# Patient Record
Sex: Male | Born: 1955 | Race: White | Hispanic: No
Health system: Southern US, Community
[De-identification: ages and names within clinical notes are randomized; demographics above are authoritative.]

## PROBLEM LIST (undated history)

## (undated) DIAGNOSIS — F1010 Alcohol abuse, uncomplicated: Secondary | ICD-10-CM

## (undated) DIAGNOSIS — F419 Anxiety disorder, unspecified: Secondary | ICD-10-CM

## (undated) DIAGNOSIS — I10 Essential (primary) hypertension: Secondary | ICD-10-CM

## (undated) DIAGNOSIS — K219 Gastro-esophageal reflux disease without esophagitis: Secondary | ICD-10-CM

## (undated) DIAGNOSIS — M1990 Unspecified osteoarthritis, unspecified site: Secondary | ICD-10-CM

## (undated) HISTORY — PX: COLONOSCOPY: SHX174

## (undated) HISTORY — PX: LACERATION REPAIR: SHX5168

---

## 2013-09-07 ENCOUNTER — Telehealth: Payer: Self-pay | Admitting: *Deleted

## 2013-09-07 ENCOUNTER — Encounter: Payer: Self-pay | Admitting: Podiatry

## 2013-09-07 ENCOUNTER — Ambulatory Visit (INDEPENDENT_AMBULATORY_CARE_PROVIDER_SITE_OTHER): Payer: PRIVATE HEALTH INSURANCE | Admitting: Podiatry

## 2013-09-07 DIAGNOSIS — L608 Other nail disorders: Secondary | ICD-10-CM

## 2013-09-07 DIAGNOSIS — B079 Viral wart, unspecified: Secondary | ICD-10-CM

## 2013-09-07 NOTE — Telephone Encounter (Signed)
Left 1st toenail fragments sent to [facility] for definitive diagnosis of fungal elements. 

## 2013-09-07 NOTE — Progress Notes (Signed)
   Subjective:    Patient ID: Martin Harrington, male    DOB: 1956-03-11, 58 y.o.   MRN: [id number]  HPI Comments: Plantars wart on the left foot second met , it has been there for over ten years. Treated with over the counter . Left great toenail is also thick discolored and painful      Review of Systems  All other systems reviewed and are negative.       Objective:   Physical Exam: I did reviewed his past medical history medications allergies surgeries and social history. Vital signs are stable he is alert and oriented x3. Pulses are strongly palpable bilateral. Neurologic sensorium is intact per since once the monofilament. Deep tendon reflexes are intact bilateral brisk bilateral. Muscle strength is 5 over 5 dorsiflexors plantar flexors inverters everters all intrinsic musculature is intact. Orthopedic evaluation demonstrates all joints distal to the ankle a full range of motion without crepitation he does have flexible hammertoe deformities noted bilateral. Cutaneous evaluation demonstrates a supple while hydrated cutis with exception of dystrophic possibly mycotic toenails bilateral. #1 bilateral. Also a verruca plantaris sub-second metatarsophalangeal joint of the left foot. This is confirmed by skin lines circumventing the lesion as well as thrombosed capillaries are visible upon debridement of superficial skin.        Assessment & Plan:  Assessment: Dystrophic nails and probable tinea pedis care rule out onychomycosis without pathology. Verruca plantaris plantar aspect of the left foot.  Plan: Discussed etiology pathology conservative versus surgical therapies at this point he does not with the wart surgically removed. I did apply acid to the wart and we'll remain covered until tomorrow at which time we will wash the S. it off thoroughly and apply a Band-Aid. I took samples of the nails 1 through 5 today bilaterally and sent that for mycotic evaluation. I will followup with him once his  pathology comes in.

## 2013-10-10 ENCOUNTER — Encounter: Payer: Self-pay | Admitting: Podiatry

## 2013-10-10 ENCOUNTER — Ambulatory Visit (INDEPENDENT_AMBULATORY_CARE_PROVIDER_SITE_OTHER): Payer: PRIVATE HEALTH INSURANCE | Admitting: Podiatry

## 2013-10-10 VITALS — BP 130/102 | HR 68 | Resp 16

## 2013-10-10 DIAGNOSIS — B079 Viral wart, unspecified: Secondary | ICD-10-CM

## 2013-10-10 NOTE — Progress Notes (Signed)
Check plantars wart left foot . It seems to be getting better.  Objective: Vital signs are stable alert and oriented x3 solitary porokeratotic lesion sub-second metatarsophalangeal joint of the left foot demonstrates no erythema edema saline is drainage or odor just a porokeratosis.  Assessment: Porokeratosis sub-second metatarsal left foot.  Plan: Chemical debridement of the lesion today followup with him as needed for debridement. Pathology report does not demonstrate any type of onychomycosis nail dystrophies the diagnosis pair

## 2013-10-11 ENCOUNTER — Telehealth: Payer: Self-pay | Admitting: *Deleted

## 2013-10-11 NOTE — Telephone Encounter (Signed)
Per Dr. Al CorpusHyatt, I attempted to call the patient to give culture result.  No fungus was found.  May start the patient on Nuvail for dystrophic nails if he would like.  I left a message for a return call.

## 2013-10-13 ENCOUNTER — Encounter: Payer: Self-pay | Admitting: Podiatry

## 2013-10-26 NOTE — Telephone Encounter (Signed)
Pt called for fungal result. I informed the fungal cultures were negative, and offered the Nuvail as Dr Al CorpusHyatt recommended.  Pt states will call our office or go through the [place] office at his next appt and [facility] to order the Nuvail.

## 2013-11-06 ENCOUNTER — Ambulatory Visit (INDEPENDENT_AMBULATORY_CARE_PROVIDER_SITE_OTHER): Payer: PRIVATE HEALTH INSURANCE | Admitting: Podiatry

## 2013-11-06 VITALS — Resp 16 | Ht 69.0 in | Wt 155.0 lb

## 2013-11-06 DIAGNOSIS — Q828 Other specified congenital malformations of skin: Secondary | ICD-10-CM | POA: Diagnosis not present

## 2013-11-06 MED ORDER — NUVAIL EX SOLN: CUTANEOUS | Status: DC

## 2013-11-06 NOTE — Progress Notes (Signed)
Presents today for followup of the reactive hyperkeratotic lesion plantar aspect of the left foot. He states is doing much better.  Objective: Vital signs are stable he is alert and oriented x3. Pulses are palpable. Subsecond lesion appears to be doing much better though it is reactive in his growth. It does not appear to be verrucoid in nature.  Assessment: Porokeratosis sub-second metatarsal left foot.  Plan: Sharp debridement of reactive hyperkeratosis today and packed with a salicylic acid packing he will he bone for 3-4 days then washed off thoroughly I will followup with him on an as-needed basis.

## 2013-11-07 ENCOUNTER — Ambulatory Visit: Payer: PRIVATE HEALTH INSURANCE | Admitting: Podiatry

## 2013-11-15 ENCOUNTER — Telehealth: Payer: Self-pay | Admitting: *Deleted

## 2013-11-15 NOTE — Telephone Encounter (Signed)
Per Dr. Al CorpusHyatt, I called patient again to inform him that his culture was negative for fungus.  He said it was probably due to trauma.  We can prescribe Nuvail to help thin them out.  He said he saw Dr. Al CorpusHyatt last week and he actually gave him a prescription for the Nuvail.

## 2016-06-01 ENCOUNTER — Encounter (HOSPITAL_COMMUNITY): Payer: Self-pay | Admitting: Emergency Medicine

## 2016-06-01 ENCOUNTER — Emergency Department (HOSPITAL_COMMUNITY): Payer: Non-veteran care

## 2016-06-01 ENCOUNTER — Emergency Department (HOSPITAL_COMMUNITY)
Admission: EM | Admit: 2016-06-01 | Discharge: 2016-06-01 | Disposition: A | Discharge status: Home / Self Care | Payer: Non-veteran care | Attending: Emergency Medicine | Admitting: Emergency Medicine

## 2016-06-01 DIAGNOSIS — F17210 Nicotine dependence, cigarettes, uncomplicated: Secondary | ICD-10-CM | POA: Diagnosis not present

## 2016-06-01 DIAGNOSIS — R0781 Pleurodynia: Secondary | ICD-10-CM | POA: Diagnosis not present

## 2016-06-01 HISTORY — DX: Alcohol abuse, uncomplicated: F10.10

## 2016-06-01 MED ORDER — TRAMADOL HCL 50 MG PO TABS: 50.0000 mg | ORAL_TABLET | Freq: Four times a day (QID) | ORAL | 0 refills | Status: DC | PRN

## 2016-06-01 MED ORDER — NAPROXEN 500 MG PO TABS: 500.0000 mg | ORAL_TABLET | Freq: Two times a day (BID) | ORAL | 0 refills | Status: DC

## 2016-06-01 MED ORDER — KETOROLAC TROMETHAMINE 60 MG/2ML IM SOLN
60.0000 mg | Freq: Once | INTRAMUSCULAR | Status: AC
  Administered 2016-06-01: 60 mg via INTRAMUSCULAR
  Filled 2016-06-01: qty 2
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
## 2016-06-01 NOTE — Discharge Instructions (Signed)
There were no acute fractures on x-ray. Follow-up with a primary care provider on this issue. Ibuprofen or naproxen for pain. Tramadol for severe pain. Do not take medications like tramadol while consuming alcohol, driving, or performing other dangerous activities.

## 2016-06-01 NOTE — ED Notes (Signed)
Declined W/C at D/C and was escorted to lobby by RN. 

## 2016-06-01 NOTE — ED Triage Notes (Addendum)
PT has breathe sounds bilaterally. No abnormalities visualized.

## 2016-06-01 NOTE — ED Notes (Signed)
Patient transported to X-ray 

## 2018-04-09 ENCOUNTER — Encounter (HOSPITAL_COMMUNITY): Payer: Self-pay

## 2018-04-09 ENCOUNTER — Emergency Department (HOSPITAL_COMMUNITY): Payer: Non-veteran care

## 2018-04-09 ENCOUNTER — Other Ambulatory Visit: Payer: Self-pay

## 2018-04-09 ENCOUNTER — Observation Stay (HOSPITAL_COMMUNITY)
Admission: EM | Admit: 2018-04-09 | Discharge: 2018-04-10 | Disposition: A | Discharge status: Home / Self Care | Payer: No Typology Code available for payment source | Attending: Internal Medicine | Admitting: Internal Medicine

## 2018-04-09 DIAGNOSIS — S0291XA Unspecified fracture of skull, initial encounter for closed fracture: Secondary | ICD-10-CM | POA: Diagnosis present

## 2018-04-09 DIAGNOSIS — S065XAA Traumatic subdural hemorrhage with loss of consciousness status unknown, initial encounter: Secondary | ICD-10-CM | POA: Diagnosis present

## 2018-04-09 DIAGNOSIS — E860 Dehydration: Secondary | ICD-10-CM | POA: Insufficient documentation

## 2018-04-09 DIAGNOSIS — S0211HA Other fracture of occiput, left side, initial encounter for closed fracture: Secondary | ICD-10-CM | POA: Diagnosis not present

## 2018-04-09 DIAGNOSIS — Y908 Blood alcohol level of 240 mg/100 ml or more: Secondary | ICD-10-CM | POA: Insufficient documentation

## 2018-04-09 DIAGNOSIS — W19XXXA Unspecified fall, initial encounter: Secondary | ICD-10-CM | POA: Insufficient documentation

## 2018-04-09 DIAGNOSIS — F1010 Alcohol abuse, uncomplicated: Secondary | ICD-10-CM | POA: Diagnosis present

## 2018-04-09 DIAGNOSIS — E876 Hypokalemia: Secondary | ICD-10-CM | POA: Insufficient documentation

## 2018-04-09 DIAGNOSIS — R030 Elevated blood-pressure reading, without diagnosis of hypertension: Secondary | ICD-10-CM | POA: Diagnosis present

## 2018-04-09 DIAGNOSIS — I629 Nontraumatic intracranial hemorrhage, unspecified: Secondary | ICD-10-CM

## 2018-04-09 DIAGNOSIS — S02102A Fracture of base of skull, left side, initial encounter for closed fracture: Secondary | ICD-10-CM

## 2018-04-09 DIAGNOSIS — I10 Essential (primary) hypertension: Secondary | ICD-10-CM | POA: Insufficient documentation

## 2018-04-09 DIAGNOSIS — F17210 Nicotine dependence, cigarettes, uncomplicated: Secondary | ICD-10-CM | POA: Insufficient documentation

## 2018-04-09 DIAGNOSIS — S065X9A Traumatic subdural hemorrhage with loss of consciousness of unspecified duration, initial encounter: Secondary | ICD-10-CM

## 2018-04-09 DIAGNOSIS — S065X0A Traumatic subdural hemorrhage without loss of consciousness, initial encounter: Secondary | ICD-10-CM | POA: Insufficient documentation

## 2018-04-09 DIAGNOSIS — S0219XA Other fracture of base of skull, initial encounter for closed fracture: Principal | ICD-10-CM | POA: Insufficient documentation

## 2018-04-09 DIAGNOSIS — F10129 Alcohol abuse with intoxication, unspecified: Secondary | ICD-10-CM | POA: Insufficient documentation

## 2018-04-09 DIAGNOSIS — S02119A Unspecified fracture of occiput, initial encounter for closed fracture: Secondary | ICD-10-CM | POA: Insufficient documentation

## 2018-04-09 HISTORY — DX: Essential (primary) hypertension: I10

## 2018-04-09 LAB — CBC WITH DIFFERENTIAL/PLATELET
ABS IMMATURE GRANULOCYTES: 0 10*3/uL (ref 0.0–0.1)
Abs Immature Granulocytes: 0 10*3/uL (ref 0.0–0.1)
BASOS ABS: 0.1 10*3/uL (ref 0.0–0.1)
BASOS PCT: 1 %
Basophils Absolute: 0.1 10*3/uL (ref 0.0–0.1)
Basophils Relative: 1 %
EOS ABS: 0.2 10*3/uL (ref 0.0–0.7)
EOS ABS: 0.1 10*3/uL (ref 0.0–0.7)
EOS PCT: 1 %
Eosinophils Relative: 2 %
HCT: 48.2 % (ref 39.0–52.0)
HEMATOCRIT: 48 % (ref 39.0–52.0)
HEMOGLOBIN: 15.5 g/dL (ref 13.0–17.0)
Hemoglobin: 16 g/dL (ref 13.0–17.0)
IMMATURE GRANULOCYTES: 0 %
Immature Granulocytes: 0 %
LYMPHS ABS: 2.5 10*3/uL (ref 0.7–4.0)
LYMPHS PCT: 11 %
Lymphocytes Relative: 32 %
Lymphs Abs: 1.1 10*3/uL (ref 0.7–4.0)
MCH: 30.9 pg (ref 26.0–34.0)
MCH: 30.8 pg (ref 26.0–34.0)
MCHC: 33.3 g/dL (ref 30.0–36.0)
MCHC: 32.2 g/dL (ref 30.0–36.0)
MCV: 92.7 fL (ref 78.0–100.0)
MCV: 95.8 fL (ref 78.0–100.0)
MONO ABS: 0.7 10*3/uL (ref 0.1–1.0)
Monocytes Absolute: 0.8 10*3/uL (ref 0.1–1.0)
Monocytes Relative: 10 %
Monocytes Relative: 8 %
NEUTROS ABS: 4.3 10*3/uL (ref 1.7–7.7)
Neutro Abs: 7.8 10*3/uL — ABNORMAL HIGH (ref 1.7–7.7)
Neutrophils Relative %: 55 %
Neutrophils Relative %: 79 %
Platelets: 230 10*3/uL (ref 150–400)
Platelets: 224 10*3/uL (ref 150–400)
RBC: 5.18 MIL/uL (ref 4.22–5.81)
RBC: 5.03 MIL/uL (ref 4.22–5.81)
RDW: 13.7 % (ref 11.5–15.5)
RDW: 14 % (ref 11.5–15.5)
WBC: 7.8 10*3/uL (ref 4.0–10.5)
WBC: 9.8 10*3/uL (ref 4.0–10.5)

## 2018-04-09 LAB — COMPREHENSIVE METABOLIC PANEL
ALBUMIN: 4 g/dL (ref 3.5–5.0)
ALT: 16 U/L (ref 0–44)
AST: 21 U/L (ref 15–41)
Alkaline Phosphatase: 67 U/L (ref 38–126)
Anion gap: 14 (ref 5–15)
BILIRUBIN TOTAL: 0.5 mg/dL (ref 0.3–1.2)
BUN: 11 mg/dL (ref 6–20)
CO2: 26 mmol/L (ref 22–32)
Calcium: 9.2 mg/dL (ref 8.9–10.3)
Chloride: 98 mmol/L (ref 98–111)
Creatinine, Ser: 0.76 mg/dL (ref 0.61–1.24)
GFR calc Af Amer: >60 mL/min (ref >60)
GFR calc non Af Amer: >60 mL/min (ref >60)
GLUCOSE: 102 mg/dL — AB (ref 70–99)
POTASSIUM: 3.2 mmol/L — AB (ref 3.5–5.1)
Sodium: 138 mmol/L (ref 135–145)
TOTAL PROTEIN: 6.9 g/dL (ref 6.5–8.1)

## 2018-04-09 LAB — HEPATIC FUNCTION PANEL
ALBUMIN: 3.7 g/dL (ref 3.5–5.0)
ALK PHOS: 61 U/L (ref 38–126)
ALT: 15 U/L (ref 0–44)
AST: 20 U/L (ref 15–41)
BILIRUBIN DIRECT: 0.1 mg/dL (ref 0.0–0.2)
BILIRUBIN INDIRECT: 0.3 mg/dL (ref 0.3–0.9)
BILIRUBIN TOTAL: 0.4 mg/dL (ref 0.3–1.2)
Total Protein: 6.3 g/dL — ABNORMAL LOW (ref 6.5–8.1)

## 2018-04-09 LAB — BASIC METABOLIC PANEL
Anion gap: 11 (ref 5–15)
BUN: 10 mg/dL (ref 6–20)
CALCIUM: 8.4 mg/dL — AB (ref 8.9–10.3)
CHLORIDE: 106 mmol/L (ref 98–111)
CO2: 23 mmol/L (ref 22–32)
CREATININE: 0.6 mg/dL — AB (ref 0.61–1.24)
GFR calc non Af Amer: >60 mL/min (ref >60)
Glucose, Bld: 113 mg/dL — ABNORMAL HIGH (ref 70–99)
Potassium: 3.6 mmol/L (ref 3.5–5.1)
Sodium: 140 mmol/L (ref 135–145)

## 2018-04-09 LAB — ETHANOL: Alcohol, Ethyl (B): 353 mg/dL (ref <10)

## 2018-04-09 LAB — CBG MONITORING, ED
GLUCOSE-CAPILLARY: 92 mg/dL (ref 70–99)
Glucose-Capillary: 108 mg/dL — ABNORMAL HIGH (ref 70–99)
Glucose-Capillary: 68 mg/dL — ABNORMAL LOW (ref 70–99)

## 2018-04-09 LAB — HIV ANTIBODY (ROUTINE TESTING W REFLEX): HIV Screen 4th Generation wRfx: NONREACTIVE

## 2018-04-09 LAB — MAGNESIUM: MAGNESIUM: 1.7 mg/dL (ref 1.7–2.4)

## 2018-04-09 MED ORDER — IBUPROFEN 200 MG PO TABS
400.0000 mg | ORAL_TABLET | Freq: Three times a day (TID) | ORAL | Status: DC
  Administered 2018-04-09 – 2018-04-10 (×2): 400 mg via ORAL
  Filled 2018-04-09: qty 1
  Filled 2018-04-09: qty 2

## 2018-04-09 MED ORDER — MAGNESIUM SULFATE 2 GM/50ML IV SOLN
2.0000 g | Freq: Once | INTRAVENOUS | Status: AC
  Administered 2018-04-09: 2 g via INTRAVENOUS
  Filled 2018-04-09: qty 50

## 2018-04-09 MED ORDER — ACETAMINOPHEN 325 MG PO TABS
650.0000 mg | ORAL_TABLET | Freq: Four times a day (QID) | ORAL | Status: DC | PRN
  Administered 2018-04-10: 650 mg via ORAL
  Filled 2018-04-09: qty 2

## 2018-04-09 MED ORDER — KETOROLAC TROMETHAMINE 15 MG/ML IJ SOLN
15.0000 mg | Freq: Four times a day (QID) | INTRAMUSCULAR | Status: DC | PRN
  Administered 2018-04-10: 15 mg via INTRAVENOUS
  Filled 2018-04-09: qty 1

## 2018-04-09 MED ORDER — IOPAMIDOL (ISOVUE-370) INJECTION 76%
INTRAVENOUS | Status: AC
  Filled 2018-04-09: qty 50

## 2018-04-09 MED ORDER — POTASSIUM CHLORIDE CRYS ER 20 MEQ PO TBCR
40.0000 meq | EXTENDED_RELEASE_TABLET | Freq: Once | ORAL | Status: AC
  Administered 2018-04-09: 40 meq via ORAL
  Filled 2018-04-09: qty 2

## 2018-04-09 MED ORDER — ONDANSETRON HCL 4 MG/2ML IJ SOLN: 4.0000 mg | Freq: Four times a day (QID) | INTRAMUSCULAR | Status: DC | PRN

## 2018-04-09 MED ORDER — PANTOPRAZOLE SODIUM 40 MG PO TBEC
40.0000 mg | DELAYED_RELEASE_TABLET | Freq: Every day | ORAL | Status: DC
  Administered 2018-04-09 – 2018-04-10 (×2): 40 mg via ORAL
  Filled 2018-04-09 (×2): qty 1

## 2018-04-09 MED ORDER — ACETAMINOPHEN 650 MG RE SUPP: 650.0000 mg | Freq: Four times a day (QID) | RECTAL | Status: DC | PRN

## 2018-04-09 MED ORDER — SODIUM CHLORIDE 0.9 % IV BOLUS
1000.0000 mL | Freq: Once | INTRAVENOUS | Status: AC
  Administered 2018-04-09: 1000 mL via INTRAVENOUS

## 2018-04-09 MED ORDER — LORAZEPAM 2 MG/ML IJ SOLN: 0.0000 mg | Freq: Two times a day (BID) | INTRAMUSCULAR | Status: DC

## 2018-04-09 MED ORDER — SODIUM CHLORIDE 0.9 % IV SOLN
INTRAVENOUS | Status: DC
  Administered 2018-04-09: 10:00:00 via INTRAVENOUS

## 2018-04-09 MED ORDER — LORAZEPAM 2 MG/ML IJ SOLN
1.0000 mg | Freq: Four times a day (QID) | INTRAMUSCULAR | Status: DC | PRN
  Administered 2018-04-09 – 2018-04-10 (×2): 1 mg via INTRAVENOUS
  Filled 2018-04-09 (×2): qty 1

## 2018-04-09 MED ORDER — HYDRALAZINE HCL 20 MG/ML IJ SOLN
5.0000 mg | INTRAMUSCULAR | Status: DC | PRN
  Administered 2018-04-09 – 2018-04-10 (×4): 5 mg via INTRAVENOUS
  Filled 2018-04-09 (×4): qty 1

## 2018-04-09 MED ORDER — ADULT MULTIVITAMIN W/MINERALS CH
1.0000 | ORAL_TABLET | Freq: Every day | ORAL | Status: DC
  Administered 2018-04-09 – 2018-04-10 (×2): 1 via ORAL
  Filled 2018-04-09 (×2): qty 1

## 2018-04-09 MED ORDER — LORAZEPAM 1 MG PO TABS: 1.0000 mg | ORAL_TABLET | Freq: Four times a day (QID) | ORAL | Status: DC | PRN

## 2018-04-09 MED ORDER — DEXTROSE IN LACTATED RINGERS 5 % IV SOLN
INTRAVENOUS | Status: DC
  Administered 2018-04-09 – 2018-04-10 (×2): via INTRAVENOUS

## 2018-04-09 MED ORDER — IOPAMIDOL (ISOVUE-370) INJECTION 76%
50.0000 mL | Freq: Once | INTRAVENOUS | Status: AC | PRN
  Administered 2018-04-09: 50 mL via INTRAVENOUS

## 2018-04-09 MED ORDER — VITAMIN B-1 100 MG PO TABS
100.0000 mg | ORAL_TABLET | Freq: Every day | ORAL | Status: DC
  Filled 2018-04-09: qty 1

## 2018-04-09 MED ORDER — THIAMINE HCL 100 MG/ML IJ SOLN
100.0000 mg | Freq: Every day | INTRAMUSCULAR | Status: DC
  Administered 2018-04-09 – 2018-04-10 (×2): 100 mg via INTRAVENOUS
  Filled 2018-04-09 (×2): qty 2

## 2018-04-09 MED ORDER — ONDANSETRON HCL 4 MG PO TABS: 4.0000 mg | ORAL_TABLET | Freq: Four times a day (QID) | ORAL | Status: DC | PRN

## 2018-04-09 MED ORDER — FOLIC ACID 1 MG PO TABS: 1.0000 mg | ORAL_TABLET | Freq: Every day | ORAL | Status: DC

## 2018-04-09 MED ORDER — LORAZEPAM 2 MG/ML IJ SOLN
0.0000 mg | Freq: Four times a day (QID) | INTRAMUSCULAR | Status: DC
  Administered 2018-04-09: 1 mg via INTRAVENOUS
  Filled 2018-04-09: qty 1

## 2018-04-09 NOTE — ED Notes (Signed)
Sitter Deforest HoylesK Cane at bedside

## 2018-04-09 NOTE — ED Notes (Signed)
Hard C Collar changed to aspen collar- pt responsive, c/o pain in left jaw/teeth area.

## 2018-04-09 NOTE — ED Notes (Signed)
c-collar removed per order
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
## 2018-04-09 NOTE — ED Provider Notes (Signed)
[facility] EMERGENCY DEPARTMENT Provider Note   CSN: [id number] Arrival date & time: 04/09/18  0133     History   Chief Complaint Chief Complaint  Patient presents with  . Fall    HPI Martin Harrington is a 62 y.o. male.  Patient brought to the emergency department by ambulance from home.  Patient has reportedly been drinking heavily tonight.  He apparently went outside at some points and fell.  Fall was unwitnessed.  Family found him lying on the ground at the bottom of 2 steps.  He was lying on concrete.  He appears to have lacerations on the back of his head and bleeding from his left ear, according to EMS.  Patient is alert at arrival, does not remember falling.  He denies any pain at this time.  He is obviously intoxicated.     Past Medical History:  Diagnosis Date  . Hypertension     There are no active problems to display for this patient.   History reviewed. No pertinent surgical history.      Home Medications    Prior to Admission medications   Medication Sig Start Date End Date Taking? Authorizing Provider  Bismuth Subsalicylate (STOMACH RELIEF PO) Take 1 tablet by mouth daily.   Yes [provider]    Family History History reviewed. No pertinent family history.  Social History Social History   Tobacco Use  . Smoking status: Current Every Day Smoker    Packs/day: 1.00  . Smokeless tobacco: Never Used  Substance Use Topics  . Alcohol use: Yes  . Drug use: Not Currently     Allergies   Patient has no known allergies.   Review of Systems Review of Systems  Unable to perform ROS: Other (intoxication)     Physical Exam Updated Vital Signs BP (!) 140/97   Pulse 89   Temp (!) 97 F (36.1 C) (Temporal)   Resp 16   Ht 5\' 9"  (1.753 m)   Wt 74.8 kg   SpO2 95%   BMI 24.37 kg/m   Physical Exam  Constitutional: He appears well-developed and well-nourished.  HENT:  Head: Normocephalic. Head is with abrasion  and with contusion. Head laceration: occipital scalp.    Left ear canal obscured by blood, cannot visualize tympanic membrane, no obvious lacerations noted to the canal  Eyes: Pupils are equal, round, and reactive to light. Right eye exhibits nystagmus. Left eye exhibits nystagmus.  Neck: Neck supple.  Cardiovascular: Normal rate and regular rhythm.  Pulmonary/Chest: Effort normal and breath sounds normal.  Abdominal: Soft. There is no tenderness.  Musculoskeletal: Normal range of motion. He exhibits no edema, tenderness or deformity.  Neurological: He is alert. He has normal strength. No cranial nerve deficit or sensory deficit. GCS eye subscore is 4. GCS verbal subscore is 4. GCS motor subscore is 6.  Skin: Laceration noted.  Psychiatric: His speech is slurred.     ED Treatments / Results  Labs (all labs ordered are listed, but only abnormal results are displayed) Labs Reviewed  COMPREHENSIVE METABOLIC PANEL - Abnormal; Notable for the following components:      Result Value   Potassium 3.2 (*)    Glucose, Bld 102 (*)    All other components within normal limits  ETHANOL - Abnormal; Notable for the following components:   Alcohol, Ethyl (B) 353 (*)    All other components within normal limits  CBG MONITORING, ED - Abnormal; Notable for the following components:  Glucose-Capillary 108 (*)    All other components within normal limits  CBG MONITORING, ED - Abnormal; Notable for the following components:   Glucose-Capillary 68 (*)    All other components within normal limits  CBC WITH DIFFERENTIAL/PLATELET    EKG None  Radiology Ct Angio Head W Or Wo Contrast  Result Date: 04/09/2018 CLINICAL DATA:  Trauma, skull fracture at high risk for arterial injury. EXAM: CT ANGIOGRAPHY HEAD AND NECK TECHNIQUE: Multidetector CT imaging of the head and neck was performed using the standard protocol during bolus administration of intravenous contrast. Multiplanar CT image reconstructions  and MIPs were obtained to evaluate the vascular anatomy. Carotid stenosis measurements (when applicable) are obtained utilizing NASCET criteria, using the distal internal carotid diameter as the denominator. CONTRAST:  50mL ISOVUE-370 IOPAMIDOL (ISOVUE-370) INJECTION 76% COMPARISON:  CT HEAD and cervical spine April 09, 2018 at 0156 hours FINDINGS: CTA NECK FINDINGS: AORTIC ARCH: Normal appearance of the thoracic arch, normal branch pattern. Mild calcific atherosclerosis aortic arch. The origins of the innominate, left Common carotid artery and subclavian artery are widely patent. RIGHT CAROTID SYSTEM: Common carotid artery is patent. Moderate calcific atherosclerosis of the carotid bifurcation without hemodynamically significant stenosis by NASCET criteria. Normal appearance of the internal carotid artery. LEFT CAROTID SYSTEM: Common carotid artery is patent. Mild calcific atherosclerosis of the carotid bifurcation without hemodynamically significant stenosis by NASCET criteria. Normal appearance of the internal carotid artery. VERTEBRAL ARTERIES:Left vertebral artery is dominant. Patent vertebral arteries mild extrinsic compression due to degenerative cervical spine. SKELETON: No acute osseous process though bone windows have not been submitted. OTHER NECK: LEFT external auditory canal, middle ear and mastoid blood products. Known fracture better seen on today's CT. Associated gas tracking in the proximal LEFT neck. UPPER CHEST: Included lung apices are clear. Mild centrilobular emphysema. No superior mediastinal lymphadenopathy. CTA HEAD FINDINGS: ANTERIOR CIRCULATION: Patent cervical internal carotid arteries, petrous, cavernous and supra clinoid internal carotid arteries. Patent anterior communicating artery. Patent anterior and middle cerebral arteries. No large vessel occlusion, significant stenosis, contrast extravasation or aneurysm. POSTERIOR CIRCULATION: Patent vertebral arteries, vertebrobasilar  junction and basilar artery, as well as main branch vessels. Patent posterior cerebral arteries. No large vessel occlusion, significant stenosis, contrast extravasation or aneurysm. VENOUS SINUSES: Major dural venous sinuses are patent though not tailored for evaluation on this angiographic examination. ANATOMIC VARIANTS: None. DELAYED PHASE: No abnormal intracranial enhancement. MIP images reviewed. IMPRESSION: CTA NECK: 1. No acute vascular injury. 2. No hemodynamically significant stenosis ICA's. Patent vertebral arteries. CTA HEAD: 1. No acute vascular injury. 2. No acute large vessel occlusion or flow-limiting stenosis. Aortic Atherosclerosis (ICD10-I70.0). Emphysema (ICD10-J43.9). Electronically Signed   By: Awilda Metro M.D.   On: 04/09/2018 04:09   Dg Chest 1 View  Result Date: 04/09/2018 CLINICAL DATA:  Post fall. EXAM: CHEST  1 VIEW COMPARISON:  None. FINDINGS: The cardiomediastinal contours are normal. The lungs are clear. Pulmonary vasculature is normal. No consolidation, pleural effusion, or pneumothorax. Multiple remote right rib fractures. No acute rib fracture visualized. IMPRESSION: No acute findings.  Remote right rib fractures. Electronically Signed   By: Narda Rutherford M.D.   On: 04/09/2018 02:39   Dg Pelvis 1-2 Views  Result Date: 04/09/2018 CLINICAL DATA:  Post fall. EXAM: PELVIS - 1-2 VIEW COMPARISON:  None. FINDINGS: The cortical margins of the bony pelvis are intact. No fracture. Pubic symphysis and sacroiliac joints are congruent. Both femoral heads are well-seated in the respective acetabula. IMPRESSION: No pelvic fracture. Electronically Signed  By: Narda Rutherford M.D.   On: 04/09/2018 02:40   Ct Head Wo Contrast  Result Date: 04/09/2018 CLINICAL DATA:  Fall tonight. Head trauma, CSF leak suspected; C-spine trauma, high clinical risk (NEXUS/CCR) EXAM: CT HEAD WITHOUT CONTRAST CT CERVICAL SPINE WITHOUT CONTRAST TECHNIQUE: Multidetector CT imaging of the head and  cervical spine was performed following the standard protocol without intravenous contrast. Multiplanar CT image reconstructions of the cervical spine were also generated. COMPARISON:  None. FINDINGS: CT HEAD FINDINGS Brain: Thin acute right subdural hematoma in the right frontal region measures approximately 4 mm. No associated mass effect or midline shift. No subarachnoid or intraparenchymal hemorrhage. No hydrocephalus. No evidence of acute ischemia. Vascular: Generalized increased density of intravascular structures without hyperdense vessel. Skull: Left-sided skull fracture involving the left occipital and temporal bone, fracture extends through the mastoid air cells with opacification, fracture plane not well characterized. Fracture extends through the skull base likely through the jugular foramen and carotid canal extending into the sphenoid sinus. Sinuses/Orbits: Left hemosinus related to skull base fracture. Opacification of left mastoid air cells related to temporal bone fracture. Undulation of left zygomatic arch likely remote injury, no acute fracture visualized. Mild right maxillary mucosal thickening. Other: Left occipital scalp hematoma. CT CERVICAL SPINE FINDINGS Alignment: Normal. Skull base and vertebrae: Skull base fracture extends through the left occipital and temporal bones and the left sphenoid sinus. No occipital condyle fracture. No cervical spine fracture. The dens is intact. Soft tissues and spinal canal: No prevertebral fluid or swelling. No visible canal hematoma. Disc levels: Diffuse disc space narrowing and endplate spurring and facet arthropathy. Modic endplate changes and vacuum phenomena at multiple levels. Upper chest: Mild emphysema.  No acute findings. Other: Carotid calcifications. IMPRESSION: 1. Acute thin right subdural hematoma measuring 4 mm without midline shift. 2. Left skull fracture involving the temporal and occipital bones extending through the mastoid air cells into  the skull base. Fracture extends through the jugular foramen and carotid canal to the sphenoid sinus. Associated left hemosinus. Recommend head and neck CTA to evaluate for vascular injury. 3. Degenerative change in the cervical spine without cervical spine fracture. Critical Value/emergent results were called by telephone at the time of interpretation on 04/09/2018 at 2:24 am to Dr. Jaci Carrel , who verbally acknowledged these results. Electronically Signed   By: Narda Rutherford M.D.   On: 04/09/2018 02:26   Ct Angio Neck W And/or Wo Contrast  Result Date: 04/09/2018 CLINICAL DATA:  Trauma, skull fracture at high risk for arterial injury. EXAM: CT ANGIOGRAPHY HEAD AND NECK TECHNIQUE: Multidetector CT imaging of the head and neck was performed using the standard protocol during bolus administration of intravenous contrast. Multiplanar CT image reconstructions and MIPs were obtained to evaluate the vascular anatomy. Carotid stenosis measurements (when applicable) are obtained utilizing NASCET criteria, using the distal internal carotid diameter as the denominator. CONTRAST:  50mL ISOVUE-370 IOPAMIDOL (ISOVUE-370) INJECTION 76% COMPARISON:  CT HEAD and cervical spine April 09, 2018 at 0156 hours FINDINGS: CTA NECK FINDINGS: AORTIC ARCH: Normal appearance of the thoracic arch, normal branch pattern. Mild calcific atherosclerosis aortic arch. The origins of the innominate, left Common carotid artery and subclavian artery are widely patent. RIGHT CAROTID SYSTEM: Common carotid artery is patent. Moderate calcific atherosclerosis of the carotid bifurcation without hemodynamically significant stenosis by NASCET criteria. Normal appearance of the internal carotid artery. LEFT CAROTID SYSTEM: Common carotid artery is patent. Mild calcific atherosclerosis of the carotid bifurcation without hemodynamically significant stenosis by NASCET criteria.  Normal appearance of the internal carotid artery. VERTEBRAL  ARTERIES:Left vertebral artery is dominant. Patent vertebral arteries mild extrinsic compression due to degenerative cervical spine. SKELETON: No acute osseous process though bone windows have not been submitted. OTHER NECK: LEFT external auditory canal, middle ear and mastoid blood products. Known fracture better seen on today's CT. Associated gas tracking in the proximal LEFT neck. UPPER CHEST: Included lung apices are clear. Mild centrilobular emphysema. No superior mediastinal lymphadenopathy. CTA HEAD FINDINGS: ANTERIOR CIRCULATION: Patent cervical internal carotid arteries, petrous, cavernous and supra clinoid internal carotid arteries. Patent anterior communicating artery. Patent anterior and middle cerebral arteries. No large vessel occlusion, significant stenosis, contrast extravasation or aneurysm. POSTERIOR CIRCULATION: Patent vertebral arteries, vertebrobasilar junction and basilar artery, as well as main branch vessels. Patent posterior cerebral arteries. No large vessel occlusion, significant stenosis, contrast extravasation or aneurysm. VENOUS SINUSES: Major dural venous sinuses are patent though not tailored for evaluation on this angiographic examination. ANATOMIC VARIANTS: None. DELAYED PHASE: No abnormal intracranial enhancement. MIP images reviewed. IMPRESSION: CTA NECK: 1. No acute vascular injury. 2. No hemodynamically significant stenosis ICA's. Patent vertebral arteries. CTA HEAD: 1. No acute vascular injury. 2. No acute large vessel occlusion or flow-limiting stenosis. Aortic Atherosclerosis (ICD10-I70.0). Emphysema (ICD10-J43.9). Electronically Signed   By: Awilda Metroourtnay  Bloomer M.D.   On: 04/09/2018 04:09   Ct Cervical Spine Wo Contrast  Result Date: 04/09/2018 CLINICAL DATA:  Fall tonight. Head trauma, CSF leak suspected; C-spine trauma, high clinical risk (NEXUS/CCR) EXAM: CT HEAD WITHOUT CONTRAST CT CERVICAL SPINE WITHOUT CONTRAST TECHNIQUE: Multidetector CT imaging of the head and  cervical spine was performed following the standard protocol without intravenous contrast. Multiplanar CT image reconstructions of the cervical spine were also generated. COMPARISON:  None. FINDINGS: CT HEAD FINDINGS Brain: Thin acute right subdural hematoma in the right frontal region measures approximately 4 mm. No associated mass effect or midline shift. No subarachnoid or intraparenchymal hemorrhage. No hydrocephalus. No evidence of acute ischemia. Vascular: Generalized increased density of intravascular structures without hyperdense vessel. Skull: Left-sided skull fracture involving the left occipital and temporal bone, fracture extends through the mastoid air cells with opacification, fracture plane not well characterized. Fracture extends through the skull base likely through the jugular foramen and carotid canal extending into the sphenoid sinus. Sinuses/Orbits: Left hemosinus related to skull base fracture. Opacification of left mastoid air cells related to temporal bone fracture. Undulation of left zygomatic arch likely remote injury, no acute fracture visualized. Mild right maxillary mucosal thickening. Other: Left occipital scalp hematoma. CT CERVICAL SPINE FINDINGS Alignment: Normal. Skull base and vertebrae: Skull base fracture extends through the left occipital and temporal bones and the left sphenoid sinus. No occipital condyle fracture. No cervical spine fracture. The dens is intact. Soft tissues and spinal canal: No prevertebral fluid or swelling. No visible canal hematoma. Disc levels: Diffuse disc space narrowing and endplate spurring and facet arthropathy. Modic endplate changes and vacuum phenomena at multiple levels. Upper chest: Mild emphysema.  No acute findings. Other: Carotid calcifications. IMPRESSION: 1. Acute thin right subdural hematoma measuring 4 mm without midline shift. 2. Left skull fracture involving the temporal and occipital bones extending through the mastoid air cells into  the skull base. Fracture extends through the jugular foramen and carotid canal to the sphenoid sinus. Associated left hemosinus. Recommend head and neck CTA to evaluate for vascular injury. 3. Degenerative change in the cervical spine without cervical spine fracture. Critical Value/emergent results were called by telephone at the time of interpretation on 04/09/2018  at 2:24 am to Dr. Jaci Carrel , who verbally acknowledged these results. Electronically Signed   By: Narda Rutherford M.D.   On: 04/09/2018 02:26    Procedures .Critical Care Performed by: Gilda Crease, MD Authorized by: Gilda Crease, MD   Critical care provider statement:    Critical care time (minutes):  30   Critical care time was exclusive of:  Separately billable procedures and treating other patients   Critical care was necessary to treat or prevent imminent or life-threatening deterioration of the following conditions:  Trauma   Critical care was time spent personally by me on the following activities:  Ordering and performing treatments and interventions, ordering and review of laboratory studies, development of treatment plan with patient or surrogate, discussions with consultants, ordering and review of radiographic studies, pulse oximetry, evaluation of patient's response to treatment, re-evaluation of patient's condition, review of old charts and examination of patient   (including critical care time)  Medications Ordered in ED Medications  iopamidol (ISOVUE-370) 76 % injection (has no administration in time range)  sodium chloride 0.9 % bolus 1,000 mL (0 mLs Intravenous Stopped 04/09/18 0316)  iopamidol (ISOVUE-370) 76 % injection 50 mL (50 mLs Intravenous Contrast Given 04/09/18 0322)     Initial Impression / Assessment and Plan / ED Course  I have reviewed the triage vital signs and the nursing notes.  Pertinent labs & imaging results that were available during my care of the patient were  reviewed by me and considered in my medical decision making (see chart for details).     Patient presents to the emergency department for evaluation after a fall.  Fall was unwitnessed, it appears that he fell down some steps onto concrete.  Patient has a large contusion on the left occipital scalp with overlying abrasion, no repair necessary.  He was without complaints at arrival but was obviously very intoxicated.  It is therefore difficult to ascertain his mental status.  He does not have any focal neurologic deficits on exam.  CT scan does show left occipital bone fracture that extends into the skull base and involves jugular foramen and carotid canal.  Patient has a 4 mm right subdural hematoma without mass-effect.  Patient sent back to radiology for CT angiography of head and neck, no vascular injury associated with the skull fracture.  Discussed with Dr. Johnsie Cancel, on-call for neurosurgery.  Does not feel that there is any need for intervention at this time with patient's injuries, agrees with observation and will see patient in the morning for consultation.  Final Clinical Impressions(s) / ED Diagnoses   Final diagnoses:  Fall  SDH (subdural hematoma) (HCC)  Other closed fracture of left side of occipital bone, initial encounter [facility])    ED Discharge Orders    None       Gilda Crease, MD 04/09/18 [phone]

## 2018-04-09 NOTE — H&P (Signed)
History and Physical    Martin Harrington ZOX:[id number] DOB: May 06, 1956 DOA: 04/09/2018  PCP: Patient, No Pcp Per  Patient coming from: Home.  Chief Complaint: Fall.  HPI: Martin Harrington is a 62 y.o. male with history of alcoholism who had a AV alcohol drink yesterday and was found on the floor below 2 steps at his home on a concrete floor with bleeding from his left ear and occipital area.  Patient does not recall falling but does accept to having drinking alcohol yesterday.  Complains of left-sided headache and also was found to have some CSF leak from the left ear.  ED Course: In the ER patient is alert awake at times hallucinating.  Complains of seeing some aliens in the ER as per the ER physician.  CT of the head shows left temporal and occipital bone fracture with subdural hematoma on-call neurosurgeon has been consulted.  CT angiogram of the head and neck was done.  Patient admitted for further management of skull fracture and alcohol abuse with possible withdrawal.  At the time of my exam there is no active discharge from the left ear.  Review of Systems: As per HPI, rest all negative.   Past Medical History:  Diagnosis Date  . Hypertension     History reviewed. No pertinent surgical history.   reports that he has been smoking. He has been smoking about 1.00 pack per day. He has never used smokeless tobacco. He reports that he drinks alcohol. He reports that he has current or past drug history.  No Known Allergies  Family History  Problem Relation Age of Onset  . CAD Neg Hx   . Diabetes Mellitus II Neg Hx     Prior to Admission medications   Medication Sig Start Date End Date Taking? Authorizing Provider  Bismuth Subsalicylate (STOMACH RELIEF PO) Take 1 tablet by mouth daily.   Yes [provider]    Physical Exam: Vitals:   04/09/18 0545 04/09/18 0600 04/09/18 0615 04/09/18 0700  BP: (!) 142/79 (!) 167/93 (!) 158/90 (!) 168/96  Pulse: 71 72 72 72    Resp: 15 18 17 14   Temp:      TempSrc:      SpO2: 93% 94% 91% 94%  Weight:      Height:          Constitutional: Moderately built and nourished. Vitals:   04/09/18 0545 04/09/18 0600 04/09/18 0615 04/09/18 0700  BP: (!) 142/79 (!) 167/93 (!) 158/90 (!) 168/96  Pulse: 71 72 72 72  Resp: 15 18 17 14   Temp:      TempSrc:      SpO2: 93% 94% 91% 94%  Weight:      Height:       Eyes: Anicteric no pallor. ENMT: No active discharge from the ears eyes nose or mouth or ears. Neck: Neck collar in place. Respiratory: No rhonchi or crepitations. Cardiovascular: S1-S2 heard no murmurs appreciated. Abdomen: Soft nontender bowel sounds present. Musculoskeletal: No edema.  No joint effusion. Skin: No rash. Neurologic: Alert awake oriented to his name and place.  Moves all extremities.  Complains of left headache. Psychiatric: Appears normal.  Normal affect.   Labs on Admission: I have personally reviewed following labs and imaging studies  CBC: Recent Labs  Lab 04/09/18 0149  WBC 7.8  NEUTROABS 4.3  HGB 16.0  HCT 48.0  MCV 92.7  PLT 230   Basic Metabolic Panel: Recent Labs  Lab 04/09/18 0149  NA  138  K 3.2*  CL 98  CO2 26  GLUCOSE 102*  BUN 11  CREATININE 0.76  CALCIUM 9.2   GFR: Estimated Creatinine Clearance: 98.2 mL/min (by C-G formula based on SCr of 0.76 mg/dL). Liver Function Tests: Recent Labs  Lab 04/09/18 0149  AST 21  ALT 16  ALKPHOS 67  BILITOT 0.5  PROT 6.9  ALBUMIN 4.0   No results for input(s): LIPASE, AMYLASE in the last 168 hours. No results for input(s): AMMONIA in the last 168 hours. Coagulation Profile: No results for input(s): INR, PROTIME in the last 168 hours. Cardiac Enzymes: No results for input(s): CKTOTAL, CKMB, CKMBINDEX, TROPONINI in the last 168 hours. BNP (last 3 results) No results for input(s): PROBNP in the last 8760 hours. HbA1C: No results for input(s): HGBA1C in the last 72 hours. CBG: Recent Labs  Lab  04/09/18 0146 04/09/18 0234  GLUCAP 108* 68*   Lipid Profile: No results for input(s): CHOL, HDL, LDLCALC, TRIG, CHOLHDL, LDLDIRECT in the last 72 hours. Thyroid Function Tests: No results for input(s): TSH, T4TOTAL, FREET4, T3FREE, THYROIDAB in the last 72 hours. Anemia Panel: No results for input(s): VITAMINB12, FOLATE, FERRITIN, TIBC, IRON, RETICCTPCT in the last 72 hours. Urine analysis: No results found for: COLORURINE, APPEARANCEUR, LABSPEC, PHURINE, GLUCOSEU, HGBUR, BILIRUBINUR, KETONESUR, PROTEINUR, UROBILINOGEN, NITRITE, LEUKOCYTESUR Sepsis Labs: @LABRCNTIP (procalcitonin:4,lacticidven:4) )No results found for this or any previous visit (from the past 240 hour(s)).   Radiological Exams on Admission: Ct Angio Head W Or Wo Contrast  Result Date: 04/09/2018 CLINICAL DATA:  Trauma, skull fracture at high risk for arterial injury. EXAM: CT ANGIOGRAPHY HEAD AND NECK TECHNIQUE: Multidetector CT imaging of the head and neck was performed using the standard protocol during bolus administration of intravenous contrast. Multiplanar CT image reconstructions and MIPs were obtained to evaluate the vascular anatomy. Carotid stenosis measurements (when applicable) are obtained utilizing NASCET criteria, using the distal internal carotid diameter as the denominator. CONTRAST:  50mL ISOVUE-370 IOPAMIDOL (ISOVUE-370) INJECTION 76% COMPARISON:  CT HEAD and cervical spine April 09, 2018 at 0156 hours FINDINGS: CTA NECK FINDINGS: AORTIC ARCH: Normal appearance of the thoracic arch, normal branch pattern. Mild calcific atherosclerosis aortic arch. The origins of the innominate, left Common carotid artery and subclavian artery are widely patent. RIGHT CAROTID SYSTEM: Common carotid artery is patent. Moderate calcific atherosclerosis of the carotid bifurcation without hemodynamically significant stenosis by NASCET criteria. Normal appearance of the internal carotid artery. LEFT CAROTID SYSTEM: Common carotid  artery is patent. Mild calcific atherosclerosis of the carotid bifurcation without hemodynamically significant stenosis by NASCET criteria. Normal appearance of the internal carotid artery. VERTEBRAL ARTERIES:Left vertebral artery is dominant. Patent vertebral arteries mild extrinsic compression due to degenerative cervical spine. SKELETON: No acute osseous process though bone windows have not been submitted. OTHER NECK: LEFT external auditory canal, middle ear and mastoid blood products. Known fracture better seen on today's CT. Associated gas tracking in the proximal LEFT neck. UPPER CHEST: Included lung apices are clear. Mild centrilobular emphysema. No superior mediastinal lymphadenopathy. CTA HEAD FINDINGS: ANTERIOR CIRCULATION: Patent cervical internal carotid arteries, petrous, cavernous and supra clinoid internal carotid arteries. Patent anterior communicating artery. Patent anterior and middle cerebral arteries. No large vessel occlusion, significant stenosis, contrast extravasation or aneurysm. POSTERIOR CIRCULATION: Patent vertebral arteries, vertebrobasilar junction and basilar artery, as well as main branch vessels. Patent posterior cerebral arteries. No large vessel occlusion, significant stenosis, contrast extravasation or aneurysm. VENOUS SINUSES: Major dural venous sinuses are patent though not tailored for evaluation on this  angiographic examination. ANATOMIC VARIANTS: None. DELAYED PHASE: No abnormal intracranial enhancement. MIP images reviewed. IMPRESSION: CTA NECK: 1. No acute vascular injury. 2. No hemodynamically significant stenosis ICA's. Patent vertebral arteries. CTA HEAD: 1. No acute vascular injury. 2. No acute large vessel occlusion or flow-limiting stenosis. Aortic Atherosclerosis (ICD10-I70.0). Emphysema (ICD10-J43.9). Electronically Signed   By: Awilda Metro M.D.   On: 04/09/2018 04:09   Dg Chest 1 View  Result Date: 04/09/2018 CLINICAL DATA:  Post fall. EXAM: CHEST  1 VIEW  COMPARISON:  None. FINDINGS: The cardiomediastinal contours are normal. The lungs are clear. Pulmonary vasculature is normal. No consolidation, pleural effusion, or pneumothorax. Multiple remote right rib fractures. No acute rib fracture visualized. IMPRESSION: No acute findings.  Remote right rib fractures. Electronically Signed   By: Narda Rutherford M.D.   On: 04/09/2018 02:39   Dg Pelvis 1-2 Views  Result Date: 04/09/2018 CLINICAL DATA:  Post fall. EXAM: PELVIS - 1-2 VIEW COMPARISON:  None. FINDINGS: The cortical margins of the bony pelvis are intact. No fracture. Pubic symphysis and sacroiliac joints are congruent. Both femoral heads are well-seated in the respective acetabula. IMPRESSION: No pelvic fracture. Electronically Signed   By: Narda Rutherford M.D.   On: 04/09/2018 02:40   Ct Head Wo Contrast  Result Date: 04/09/2018 CLINICAL DATA:  Fall tonight. Head trauma, CSF leak suspected; C-spine trauma, high clinical risk (NEXUS/CCR) EXAM: CT HEAD WITHOUT CONTRAST CT CERVICAL SPINE WITHOUT CONTRAST TECHNIQUE: Multidetector CT imaging of the head and cervical spine was performed following the standard protocol without intravenous contrast. Multiplanar CT image reconstructions of the cervical spine were also generated. COMPARISON:  None. FINDINGS: CT HEAD FINDINGS Brain: Thin acute right subdural hematoma in the right frontal region measures approximately 4 mm. No associated mass effect or midline shift. No subarachnoid or intraparenchymal hemorrhage. No hydrocephalus. No evidence of acute ischemia. Vascular: Generalized increased density of intravascular structures without hyperdense vessel. Skull: Left-sided skull fracture involving the left occipital and temporal bone, fracture extends through the mastoid air cells with opacification, fracture plane not well characterized. Fracture extends through the skull base likely through the jugular foramen and carotid canal extending into the sphenoid sinus.  Sinuses/Orbits: Left hemosinus related to skull base fracture. Opacification of left mastoid air cells related to temporal bone fracture. Undulation of left zygomatic arch likely remote injury, no acute fracture visualized. Mild right maxillary mucosal thickening. Other: Left occipital scalp hematoma. CT CERVICAL SPINE FINDINGS Alignment: Normal. Skull base and vertebrae: Skull base fracture extends through the left occipital and temporal bones and the left sphenoid sinus. No occipital condyle fracture. No cervical spine fracture. The dens is intact. Soft tissues and spinal canal: No prevertebral fluid or swelling. No visible canal hematoma. Disc levels: Diffuse disc space narrowing and endplate spurring and facet arthropathy. Modic endplate changes and vacuum phenomena at multiple levels. Upper chest: Mild emphysema.  No acute findings. Other: Carotid calcifications. IMPRESSION: 1. Acute thin right subdural hematoma measuring 4 mm without midline shift. 2. Left skull fracture involving the temporal and occipital bones extending through the mastoid air cells into the skull base. Fracture extends through the jugular foramen and carotid canal to the sphenoid sinus. Associated left hemosinus. Recommend head and neck CTA to evaluate for vascular injury. 3. Degenerative change in the cervical spine without cervical spine fracture. Critical Value/emergent results were called by telephone at the time of interpretation on 04/09/2018 at 2:24 am to Dr. Jaci Carrel , who verbally acknowledged these results. Electronically Signed  By: Narda Rutherford M.D.   On: 04/09/2018 02:26   Ct Angio Neck W And/or Wo Contrast  Result Date: 04/09/2018 CLINICAL DATA:  Trauma, skull fracture at high risk for arterial injury. EXAM: CT ANGIOGRAPHY HEAD AND NECK TECHNIQUE: Multidetector CT imaging of the head and neck was performed using the standard protocol during bolus administration of intravenous contrast. Multiplanar CT image  reconstructions and MIPs were obtained to evaluate the vascular anatomy. Carotid stenosis measurements (when applicable) are obtained utilizing NASCET criteria, using the distal internal carotid diameter as the denominator. CONTRAST:  50mL ISOVUE-370 IOPAMIDOL (ISOVUE-370) INJECTION 76% COMPARISON:  CT HEAD and cervical spine April 09, 2018 at 0156 hours FINDINGS: CTA NECK FINDINGS: AORTIC ARCH: Normal appearance of the thoracic arch, normal branch pattern. Mild calcific atherosclerosis aortic arch. The origins of the innominate, left Common carotid artery and subclavian artery are widely patent. RIGHT CAROTID SYSTEM: Common carotid artery is patent. Moderate calcific atherosclerosis of the carotid bifurcation without hemodynamically significant stenosis by NASCET criteria. Normal appearance of the internal carotid artery. LEFT CAROTID SYSTEM: Common carotid artery is patent. Mild calcific atherosclerosis of the carotid bifurcation without hemodynamically significant stenosis by NASCET criteria. Normal appearance of the internal carotid artery. VERTEBRAL ARTERIES:Left vertebral artery is dominant. Patent vertebral arteries mild extrinsic compression due to degenerative cervical spine. SKELETON: No acute osseous process though bone windows have not been submitted. OTHER NECK: LEFT external auditory canal, middle ear and mastoid blood products. Known fracture better seen on today's CT. Associated gas tracking in the proximal LEFT neck. UPPER CHEST: Included lung apices are clear. Mild centrilobular emphysema. No superior mediastinal lymphadenopathy. CTA HEAD FINDINGS: ANTERIOR CIRCULATION: Patent cervical internal carotid arteries, petrous, cavernous and supra clinoid internal carotid arteries. Patent anterior communicating artery. Patent anterior and middle cerebral arteries. No large vessel occlusion, significant stenosis, contrast extravasation or aneurysm. POSTERIOR CIRCULATION: Patent vertebral arteries,  vertebrobasilar junction and basilar artery, as well as main branch vessels. Patent posterior cerebral arteries. No large vessel occlusion, significant stenosis, contrast extravasation or aneurysm. VENOUS SINUSES: Major dural venous sinuses are patent though not tailored for evaluation on this angiographic examination. ANATOMIC VARIANTS: None. DELAYED PHASE: No abnormal intracranial enhancement. MIP images reviewed. IMPRESSION: CTA NECK: 1. No acute vascular injury. 2. No hemodynamically significant stenosis ICA's. Patent vertebral arteries. CTA HEAD: 1. No acute vascular injury. 2. No acute large vessel occlusion or flow-limiting stenosis. Aortic Atherosclerosis (ICD10-I70.0). Emphysema (ICD10-J43.9). Electronically Signed   By: Awilda Metro M.D.   On: 04/09/2018 04:09   Ct Cervical Spine Wo Contrast  Result Date: 04/09/2018 CLINICAL DATA:  Fall tonight. Head trauma, CSF leak suspected; C-spine trauma, high clinical risk (NEXUS/CCR) EXAM: CT HEAD WITHOUT CONTRAST CT CERVICAL SPINE WITHOUT CONTRAST TECHNIQUE: Multidetector CT imaging of the head and cervical spine was performed following the standard protocol without intravenous contrast. Multiplanar CT image reconstructions of the cervical spine were also generated. COMPARISON:  None. FINDINGS: CT HEAD FINDINGS Brain: Thin acute right subdural hematoma in the right frontal region measures approximately 4 mm. No associated mass effect or midline shift. No subarachnoid or intraparenchymal hemorrhage. No hydrocephalus. No evidence of acute ischemia. Vascular: Generalized increased density of intravascular structures without hyperdense vessel. Skull: Left-sided skull fracture involving the left occipital and temporal bone, fracture extends through the mastoid air cells with opacification, fracture plane not well characterized. Fracture extends through the skull base likely through the jugular foramen and carotid canal extending into the sphenoid sinus.  Sinuses/Orbits: Left hemosinus related to skull base fracture.  Opacification of left mastoid air cells related to temporal bone fracture. Undulation of left zygomatic arch likely remote injury, no acute fracture visualized. Mild right maxillary mucosal thickening. Other: Left occipital scalp hematoma. CT CERVICAL SPINE FINDINGS Alignment: Normal. Skull base and vertebrae: Skull base fracture extends through the left occipital and temporal bones and the left sphenoid sinus. No occipital condyle fracture. No cervical spine fracture. The dens is intact. Soft tissues and spinal canal: No prevertebral fluid or swelling. No visible canal hematoma. Disc levels: Diffuse disc space narrowing and endplate spurring and facet arthropathy. Modic endplate changes and vacuum phenomena at multiple levels. Upper chest: Mild emphysema.  No acute findings. Other: Carotid calcifications. IMPRESSION: 1. Acute thin right subdural hematoma measuring 4 mm without midline shift. 2. Left skull fracture involving the temporal and occipital bones extending through the mastoid air cells into the skull base. Fracture extends through the jugular foramen and carotid canal to the sphenoid sinus. Associated left hemosinus. Recommend head and neck CTA to evaluate for vascular injury. 3. Degenerative change in the cervical spine without cervical spine fracture. Critical Value/emergent results were called by telephone at the time of interpretation on 04/09/2018 at 2:24 am to Dr. Jaci Carrel , who verbally acknowledged these results. Electronically Signed   By: Narda Rutherford M.D.   On: 04/09/2018 02:26    EKG: Independently reviewed.  Sinus rhythm with QTC of 496 ms.  Assessment/Plan Principal Problem:   Skull fracture (HCC) Active Problems:   Subdural hematoma (HCC)   Alcohol abuse   Elevated blood pressure reading    1. Fall with fracture of the left temporal and occipital bone with subdural hematoma -neurosurgery has been  consulted patient still is on cervical neck collar.  Will await further recommendations per neurosurgery. 2. Alcohol abuse with possible impending withdrawal on CIWA protocol. 3. Elevated blood pressure with history of hypertension not on any antihypertensive for now we will keep patient on PRN IV hydralazine closely follow blood pressure trends.   DVT prophylaxis: SCDs. Code Status: Full code. Family Communication: No family at the bedside. Disposition Plan: To be determined. Consults called: Neurosurgery. Admission status: Observation.   Eduard Clos MD [facility] Pager [phone].  If 7PM-7AM, please contact night-coverage [email or web address] Password [facility]  04/09/2018, 7:16 AM

## 2018-04-09 NOTE — Progress Notes (Signed)
   04/09/18 0100  Clinical Encounter Type  Visited With Patient not available  Visit Type ED  Chaplain responded to Trauma 2 call. Patient had fallen, l;ikely due to intoxication. Patient's wife could not come.  Offered ministry of presence as Patient was moved to stepdown.    04/09/18 0100  Clinical Encounter Type  Visited With Patient not available  Visit Type ED   Rev. Lynnell ChadVirginia Kohen Reither

## 2018-04-09 NOTE — Progress Notes (Signed)
PROGRESS NOTE    Blade Scheff  ZOX:[id number] DOB: [birth year] DOA: 04/09/2018 PCP: Patient, No Pcp Per    Brief Narrative:  62 year old male who presented after a mechanical fall.  He does have significant past medical history for alcoholism.  He was found down on the floor below 2 steps at his home on a concrete floor with bleeding from his left ear and occipital area, apparently he had been drinking alcohol.  On his initial physical examination he had intermittent hallucinations, apparently he had a cerebrospinal fluid leak from his left ear.  Blood pressure 142/79, heart rate 71, respiratory rate 15, oxygen saturation 93%.  Moist mucous membranes, lungs clear to auscultation bilaterally, heart S1-S2 present rhythmic, abdomen soft nontender, no lower extremity.  Patient was nonfocal.  Sodium 138, potassium 3.2, chloride 98, bicarb 26, glucose 102, BUN 11, creatinine 0.76, white count 7.8, hemoglobin 16.0, hematocrit 48.0, platelets 230.  Alcohol level 353, chest radiograph with right rotation, no infiltrates.  Head CT with acute thin right subdural hematoma measuring 4 mm without midline shift.  Left skull fracture involving the temporal and occipital bones extending through the mastoid air cells into the skull base.  Fracture extends through the jugular foramen carotid canal to the sphenoid sinuses.  Associated left hemo-sinus.  EKG sinus rhythm, normal axis, poor R wave progression.  Patient was admitted to the hospital with a working diagnosis of traumatic left temporal, occipital bones and skull base fracture, in the setting of alcohol intoxication.    Assessment & Plan:   Principal Problem:   Skull fracture (HCC) Active Problems:   Subdural hematoma (HCC)   Alcohol abuse   Elevated blood pressure reading   1.Left temporal and occipital bones fracture; skull base fracture. Further work up with CT angiography with no vascular compromise, will continue neuro checks, and physical  therapy evaluation. Patient continue to be a high fall risk related to acute alcoholic intoxication. Admit to medical unit, no need of cardiac monitoring at this point. Continue pain control with non steroidal anti-inflammatory agents, will avoid narcotics.   2. Subdural hematoma. Small subdural hematoma, continue neuro checks and physical therapy evaluation.   3. Alcohol intoxication. No signs of withdrawal, still sedated due to toxic alcohol levels, will continue neuro checks and as needed benzodiazepines per CIWA protocol. Thiamine and multivitamins.   4. HTN. Continue blood pressure monitoring. As needed hydralazine. Blood pressure 141 to 187 mmHg. Not on cardiac medications at home.   5. Hypomagnsemia and hypokalemia due to dehydration. Electrolyte correction with Kcl and Mg sulfate, continue hydration with dextrose and lactate ringers at 75 ml per hour, will follow on renal panel in am, renal function preserved with serum cr at 0.60.    DVT prophylaxis: scd   Code Status:  full Family Communication: no family at the bedside  Disposition Plan/ discharge barriers: pending physical therapy evaluation.    Consultants:   Neurosurgery   Procedures:     Antimicrobials:       Subjective: Patient is somnolent, positive headache and pain at the site of the rigid cervical spine collar. No nausea or vomiting, no chest pain or dyspnea.   Objective: Vitals:   04/09/18 0945 04/09/18 1015 04/09/18 1030 04/09/18 1100  BP: (!) 170/95 (!) 136/92 (!) 141/102 (!) 187/90  Pulse: 78 74 71 81  Resp: 19 (!) 23 16 19   Temp:      TempSrc:      SpO2: 92% 91% 93% 96%  Weight:  Height:        Intake/Output Summary (Last 24 hours) at 04/09/2018 1314 Last data filed at 04/09/2018 0500 Gross per 24 hour  Intake 1999.99 ml  Output 800 ml  Net 1199.99 ml   Filed Weights   04/09/18 0138  Weight: 74.8 kg    Examination:   General: deconditioned and ill looking appearing  Neurology:  somnolent but easy to arouse, non focal. Cervical collar in place.  E ENT: mild pallor, no icterus, oral mucosa dry/ dry blood on his left ear.  Cardiovascular: No JVD. S1-S2 present, rhythmic, no gallops, rubs, or murmurs. No lower extremity edema. Pulmonary: positive breath sounds bilaterally, no wheezing, rhonchi or rales. Gastrointestinal. Abdomen with no organomegaly, non tender, no rebound or guarding Skin. No rashes Musculoskeletal: no joint deformities     Data Reviewed: I have personally reviewed following labs and imaging studies  CBC: Recent Labs  Lab 04/09/18 0149 04/09/18 0751  WBC 7.8 9.8  NEUTROABS 4.3 7.8*  HGB 16.0 15.5  HCT 48.0 48.2  MCV 92.7 95.8  PLT 230 224   Basic Metabolic Panel: Recent Labs  Lab 04/09/18 0149 04/09/18 0751  NA 138 140  K 3.2* 3.6  CL 98 106  CO2 26 23  GLUCOSE 102* 113*  BUN 11 10  CREATININE 0.76 0.60*  CALCIUM 9.2 8.4*  MG  --  1.7   GFR: Estimated Creatinine Clearance: 98.2 mL/min (A) (by C-G formula based on SCr of 0.6 mg/dL (L)). Liver Function Tests: Recent Labs  Lab 04/09/18 0149 04/09/18 0751  AST 21 20  ALT 16 15  ALKPHOS 67 61  BILITOT 0.5 0.4  PROT 6.9 6.3*  ALBUMIN 4.0 3.7   No results for input(s): LIPASE, AMYLASE in the last 168 hours. No results for input(s): AMMONIA in the last 168 hours. Coagulation Profile: No results for input(s): INR, PROTIME in the last 168 hours. Cardiac Enzymes: No results for input(s): CKTOTAL, CKMB, CKMBINDEX, TROPONINI in the last 168 hours. BNP (last 3 results) No results for input(s): PROBNP in the last 8760 hours. HbA1C: No results for input(s): HGBA1C in the last 72 hours. CBG: Recent Labs  Lab 04/09/18 0146 04/09/18 0234  GLUCAP 108* 68*   Lipid Profile: No results for input(s): CHOL, HDL, LDLCALC, TRIG, CHOLHDL, LDLDIRECT in the last 72 hours. Thyroid Function Tests: No results for input(s): TSH, T4TOTAL, FREET4, T3FREE, THYROIDAB in the last 72  hours. Anemia Panel: No results for input(s): VITAMINB12, FOLATE, FERRITIN, TIBC, IRON, RETICCTPCT in the last 72 hours.    Radiology Studies: I have reviewed all of the imaging during this hospital visit personally     Scheduled Meds: . folic acid  1 mg Oral Daily  . iopamidol      . LORazepam  0-4 mg Intravenous Q6H   Followed by  . [START ON 04/11/2018] LORazepam  0-4 mg Intravenous Q12H  . multivitamin with minerals  1 tablet Oral Daily  . thiamine  100 mg Oral Daily   Or  . thiamine  100 mg Intravenous Daily   Continuous Infusions: . sodium chloride 75 mL/hr at 04/09/18 0954     LOS: 0 days        Somnang Mahan Annett Gulaaniel Adasia Hoar, MD [facility] Pager [phone]

## 2018-04-10 DIAGNOSIS — F1010 Alcohol abuse, uncomplicated: Secondary | ICD-10-CM | POA: Diagnosis not present

## 2018-04-10 DIAGNOSIS — S065X9A Traumatic subdural hemorrhage with loss of consciousness of unspecified duration, initial encounter: Secondary | ICD-10-CM | POA: Diagnosis not present

## 2018-04-10 DIAGNOSIS — I629 Nontraumatic intracranial hemorrhage, unspecified: Secondary | ICD-10-CM

## 2018-04-10 DIAGNOSIS — W19XXXA Unspecified fall, initial encounter: Secondary | ICD-10-CM

## 2018-04-10 DIAGNOSIS — F10129 Alcohol abuse with intoxication, unspecified: Secondary | ICD-10-CM

## 2018-04-10 DIAGNOSIS — S02102A Fracture of base of skull, left side, initial encounter for closed fracture: Secondary | ICD-10-CM | POA: Diagnosis not present

## 2018-04-10 LAB — BASIC METABOLIC PANEL
ANION GAP: 9 (ref 5–15)
BUN: 8 mg/dL (ref 6–20)
CHLORIDE: 105 mmol/L (ref 98–111)
CO2: 25 mmol/L (ref 22–32)
Calcium: 9 mg/dL (ref 8.9–10.3)
Creatinine, Ser: 0.59 mg/dL — ABNORMAL LOW (ref 0.61–1.24)
GFR calc non Af Amer: >60 mL/min (ref >60)
Glucose, Bld: 125 mg/dL — ABNORMAL HIGH (ref 70–99)
Potassium: 3.4 mmol/L — ABNORMAL LOW (ref 3.5–5.1)
SODIUM: 139 mmol/L (ref 135–145)

## 2018-04-10 MED ORDER — FAMOTIDINE 20 MG PO TABS: 20.0000 mg | ORAL_TABLET | Freq: Every day | ORAL | 0 refills | Status: DC

## 2018-04-10 MED ORDER — HYDROCHLOROTHIAZIDE 25 MG PO TABS: 25.0000 mg | ORAL_TABLET | Freq: Every day | ORAL | 0 refills | Status: AC

## 2018-04-10 MED ORDER — HYDROCHLOROTHIAZIDE 25 MG PO TABS
25.0000 mg | ORAL_TABLET | Freq: Every day | ORAL | Status: DC
  Administered 2018-04-10: 25 mg via ORAL
  Filled 2018-04-10: qty 1

## 2018-04-10 MED ORDER — IBUPROFEN 400 MG PO TABS: 400.0000 mg | ORAL_TABLET | Freq: Three times a day (TID) | ORAL | 0 refills | Status: DC | PRN

## 2018-04-10 MED ORDER — FAMOTIDINE 20 MG PO TABS: 20.0000 mg | ORAL_TABLET | Freq: Every day | ORAL | Status: DC

## 2018-04-10 MED ORDER — POTASSIUM CHLORIDE CRYS ER 20 MEQ PO TBCR
40.0000 meq | EXTENDED_RELEASE_TABLET | Freq: Once | ORAL | Status: AC
  Administered 2018-04-10: 40 meq via ORAL
  Filled 2018-04-10: qty 2

## 2018-04-10 NOTE — Progress Notes (Signed)
Patient discharged in stable condition with all belongings. He verbalized understanding of all discharge instructions and importance of follow up visits.  

## 2018-04-10 NOTE — Evaluation (Signed)
Physical Therapy Evaluation Patient Details Name: Martin Harrington MRN: [id number] DOB: [birth year] Today's Date: 04/10/2018   History of Present Illness  62 y.o. man s/p fall while intoxicated. CTH personally reviewed, which shows thin right acute subdural hematoma, L occipital skull frx with opacification of L mastoid, fracture pattern crosses left jugular foramen and into L carotid canal. 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   Clinical Impression  Patient seen for therapy assessment s/p fall with hospitalization from injury.  Patient is currently mobilizing well with no noted focal deficits at this time. Educated patient on limiting stimulation, memory strategies, and safety with mobility. No further acute PT needs. Will sign off.     Follow Up Recommendations No PT follow up    Equipment Recommendations  None recommended by PT    Recommendations for Other Services       Precautions / Restrictions Precautions Precautions: Fall      Mobility  Bed Mobility Overal bed mobility: Independent             General bed mobility comments: no physical assist required, good timing  Transfers Overall transfer level: Independent               General transfer comment: no difficulty  Ambulation/Gait Ambulation/Gait assistance: Independent Gait Distance (Feet): 310 Feet Assistive device: None Gait Pattern/deviations: WFL(Within Functional Limits)     General Gait Details: mild instability initially but then improved rapidly.  Stairs            Wheelchair Mobility    Modified Rankin (Stroke Patients Only)       Balance Overall balance assessment: Independent;Mild deficits observed, not formally tested                                           Pertinent Vitals/Pain      Home Living Family/patient expects to be discharged to:: Private residence Living Arrangements: Non-relatives/Friends   Type of Home: House Home  Access: Stairs to enter Entrance Stairs-Rails: None Secretary/administrator of Steps: 1 Home Layout: One level Home Equipment: None      Prior Function Level of Independence: Independent               Hand Dominance   Dominant Hand: Right    Extremity/Trunk Assessment   Upper Extremity Assessment Upper Extremity Assessment: Overall WFL for tasks assessed    Lower Extremity Assessment Lower Extremity Assessment: Overall WFL for tasks assessed       Communication   Communication: No difficulties(left ear HOH)  Cognition Arousal/Alertness: Awake/alert Behavior During Therapy: WFL for tasks assessed/performed Overall Cognitive Status: Within Functional Limits for tasks assessed                                        General Comments      Exercises     Assessment/Plan    PT Assessment Patent does not need any further PT services  PT Problem List         PT Treatment Interventions      PT Goals (Current goals can be found in the Care Plan section)  Acute Rehab PT Goals PT Goal Formulation: All assessment and education complete, DC therapy    Frequency     Barriers to discharge  Co-evaluation               AM-PAC PT "6 Clicks" Daily Activity  Outcome Measure Difficulty turning over in bed (including adjusting bedclothes, sheets and blankets)?: None Difficulty moving from lying on back to sitting on the side of the bed? : None Difficulty sitting down on and standing up from a chair with arms (e.g., wheelchair, bedside commode, etc,.)?: None Help needed moving to and from a bed to chair (including a wheelchair)?: None Help needed walking in hospital room?: None Help needed climbing 3-5 steps with a railing? : A Little 6 Click Score: 23    End of Session Equipment Utilized During Treatment: Gait belt Activity Tolerance: Patient tolerated treatment well Patient left: in chair;with call bell/phone within reach Nurse  Communication: Mobility status PT Visit Diagnosis: History of falling (Z91.81)    Time: [id number] PT Time Calculation (min) (ACUTE ONLY): 18 min   Charges:   PT Evaluation $PT Eval Low Complexity: 1 Low          Charlotte Crumbevon Wallie Lagrand, PT DPT  Board Certified Neurologic Specialist Acute Rehabilitation Services Pager [phone] Office [phone]   Fabio AsaDevon J Azusena Erlandson 04/10/2018, 12:47 PM

## 2018-04-10 NOTE — Discharge Summary (Signed)
Physician Discharge Summary  Martin Harrington ZOX:[id number] DOB: 1956-02-19 DOA: 04/09/2018  PCP: Patient, No Pcp Per  Admit date: 04/09/2018 Discharge date: 04/10/2018  Admitted From: Home  Disposition:  Home   Recommendations for Outpatient Follow-up and new medication changes:  [facility] 2. Patient has been placed on as needed Ibuprofen for headache 3. Instructed to take HCTZ daily if blood pressure at home greater than 140/90 mmHg.  4. Gastrointestinal prophylaxis with famotidine   Home Health: no   Equipment/Devices: no    Discharge Condition: stable  CODE STATUS: full  Diet recommendation: heart healthy   Brief/Interim Summary: 62 year old male who presented after a mechanical fall. He does have significant past medical history for alcoholism.He was found down on the floor below 2 steps at his home on a concrete floor with bleeding from his left ear and occipital area, apparently he had been drinking alcohol. On his initial physical examination he had intermittent hallucinations, apparently he had cerebrospinal fluid leak from his left ear. Blood pressure 142/79, heart rate 71, respiratory rate15, oxygen saturation 93%.Moist mucous membranes, lungs clear to auscultation bilaterally, heart S1-S2 present rhythmic, abdomen soft nontender, no lower extremity. Patient was nonfocal. Sodium 138, potassium 3.2, chloride 98, bicarb 26, glucose 102, BUN 11, creatinine 0.76, white count 7.8, hemoglobin 16.0, hematocrit 48.0, platelets 230.Alcohol level 353,chest radiograph with right rotation, no infiltrates. Head CT with acute thin right frontal subdural hematoma measuring 4 mm without midline shift.Left skull fracture involving the temporal and occipital bones extending through the mastoid air cells into the skull base. Fracture extends through the jugular foramen carotid canal to the sphenoid sinuses. Associated left hemo-sinus. EKG sinus rhythm, normal  axis, poor R wave progression.  Patient was admitted to the hospital with a working diagnosis of traumatic fracture of the left temporal and occipital bones, left skull base fracture and right frontal subdural hematomain the setting of acute alcohol intoxication.  1.  Left temporal and occipital bone fractures, left skull base fracture.  Patient was admitted to the medical ward, he had further work-up with CT angiography of the head and neck, which showed no acute vascular injury.  Neurosurgery was consulted with recommendations for conservative management.  Patient was seen by physical therapy, and he did not require further follow-up.   2.  Right frontal 4 mm subdural hematoma.  No midline shift, patient had frequent neuro checks, pain control with ibuprofen and IV ketorolac.  3.  Acute alcohol intoxication.  Patient received IV fluids with dextrose and balance electrolyte solutions.  As needed benzodiazepine, (lorazepam) per withdrawal CIWA protocol.  Patient responded well to the medical therapy and by the time of discharge he had no signs of acute withdrawal.  Patient was advised to avoid further alcohol consumption.  He did receive multivitamins and thiamine while hospitalized.  4.  Hypertension.  Blood pressure remain elevated, systolic reached up to 200 mmHg. Patient was placed on hydrochlorothiazide with clinical improvement.  Discharge blood pressure 145 systolic.  Patient mentions having whitecoat hypertension, he does have a blood pressure monitor at home.  He was instructed to continue hydrochlorothiazide if blood pressure at home greater than 140/90.  5.  Dehydration with hypokalemia and hypomagnesemia.  Patient received IV fluids with balanced electrolyte solutions along with dextrose, potassium and magnesium were corrected.  By the time of discharge patient tolerating p.o. diet adequately.  Discharge Diagnoses:  Principal Problem:   Skull fracture (HCC) Active Problems:    Subdural  hematoma (HCC)   Alcohol abuse   Elevated blood pressure reading   Intracranial bleed [facility])    Discharge Instructions   Allergies as of 04/10/2018   No Known Allergies     Medication List    TAKE these medications   famotidine 20 MG tablet Commonly known as:  PEPCID Take 1 tablet (20 mg total) by mouth at bedtime. Start taking on:  04/11/2018   hydrochlorothiazide 25 MG tablet Commonly known as:  HYDRODIURIL Take 1 tablet (25 mg total) by mouth daily. Please start taking daily if blood pressure at home is greater than 140/90 mmHg. Start taking on:  04/11/2018   ibuprofen 400 MG tablet Commonly known as:  ADVIL,MOTRIN Take 1 tablet (400 mg total) by mouth every 8 (eight) hours as needed (headache).   STOMACH RELIEF PO Take 1 tablet by mouth daily.       No Known Allergies  Consultations:  Neurosurgery    Procedures/Studies: Ct Angio Head W Or Wo Contrast  Result Date: 04/09/2018 CLINICAL DATA:  Trauma, skull fracture at high risk for arterial injury. EXAM: CT ANGIOGRAPHY HEAD AND NECK TECHNIQUE: Multidetector CT imaging of the head and neck was performed using the standard protocol during bolus administration of intravenous contrast. Multiplanar CT image reconstructions and MIPs were obtained to evaluate the vascular anatomy. Carotid stenosis measurements (when applicable) are obtained utilizing NASCET criteria, using the distal internal carotid diameter as the denominator. CONTRAST:  50mL ISOVUE-370 IOPAMIDOL (ISOVUE-370) INJECTION 76% COMPARISON:  CT HEAD and cervical spine April 09, 2018 at 0156 hours FINDINGS: CTA NECK FINDINGS: AORTIC ARCH: Normal appearance of the thoracic arch, normal branch pattern. Mild calcific atherosclerosis aortic arch. The origins of the innominate, left Common carotid artery and subclavian artery are widely patent. RIGHT CAROTID SYSTEM: Common carotid artery is patent. Moderate calcific atherosclerosis of the carotid bifurcation  without hemodynamically significant stenosis by NASCET criteria. Normal appearance of the internal carotid artery. LEFT CAROTID SYSTEM: Common carotid artery is patent. Mild calcific atherosclerosis of the carotid bifurcation without hemodynamically significant stenosis by NASCET criteria. Normal appearance of the internal carotid artery. VERTEBRAL ARTERIES:Left vertebral artery is dominant. Patent vertebral arteries mild extrinsic compression due to degenerative cervical spine. SKELETON: No acute osseous process though bone windows have not been submitted. OTHER NECK: LEFT external auditory canal, middle ear and mastoid blood products. Known fracture better seen on today's CT. Associated gas tracking in the proximal LEFT neck. UPPER CHEST: Included lung apices are clear. Mild centrilobular emphysema. No superior mediastinal lymphadenopathy. CTA HEAD FINDINGS: ANTERIOR CIRCULATION: Patent cervical internal carotid arteries, petrous, cavernous and supra clinoid internal carotid arteries. Patent anterior communicating artery. Patent anterior and middle cerebral arteries. No large vessel occlusion, significant stenosis, contrast extravasation or aneurysm. POSTERIOR CIRCULATION: Patent vertebral arteries, vertebrobasilar junction and basilar artery, as well as main branch vessels. Patent posterior cerebral arteries. No large vessel occlusion, significant stenosis, contrast extravasation or aneurysm. VENOUS SINUSES: Major dural venous sinuses are patent though not tailored for evaluation on this angiographic examination. ANATOMIC VARIANTS: None. DELAYED PHASE: No abnormal intracranial enhancement. MIP images reviewed. IMPRESSION: CTA NECK: 1. No acute vascular injury. 2. No hemodynamically significant stenosis ICA's. Patent vertebral arteries. CTA HEAD: 1. No acute vascular injury. 2. No acute large vessel occlusion or flow-limiting stenosis. Aortic Atherosclerosis (ICD10-I70.0). Emphysema (ICD10-J43.9). Electronically  Signed   By: Awilda Metro M.D.   On: 04/09/2018 04:09   Dg Chest 1 View  Result Date: 04/09/2018 CLINICAL DATA:  Post fall. EXAM: CHEST  1  VIEW COMPARISON:  None. FINDINGS: The cardiomediastinal contours are normal. The lungs are clear. Pulmonary vasculature is normal. No consolidation, pleural effusion, or pneumothorax. Multiple remote right rib fractures. No acute rib fracture visualized. IMPRESSION: No acute findings.  Remote right rib fractures. Electronically Signed   By: Narda Rutherford M.D.   On: 04/09/2018 02:39   Dg Pelvis 1-2 Views  Result Date: 04/09/2018 CLINICAL DATA:  Post fall. EXAM: PELVIS - 1-2 VIEW COMPARISON:  None. FINDINGS: The cortical margins of the bony pelvis are intact. No fracture. Pubic symphysis and sacroiliac joints are congruent. Both femoral heads are well-seated in the respective acetabula. IMPRESSION: No pelvic fracture. Electronically Signed   By: Narda Rutherford M.D.   On: 04/09/2018 02:40   Ct Head Wo Contrast  Result Date: 04/09/2018 CLINICAL DATA:  Fall tonight. Head trauma, CSF leak suspected; C-spine trauma, high clinical risk (NEXUS/CCR) EXAM: CT HEAD WITHOUT CONTRAST CT CERVICAL SPINE WITHOUT CONTRAST TECHNIQUE: Multidetector CT imaging of the head and cervical spine was performed following the standard protocol without intravenous contrast. Multiplanar CT image reconstructions of the cervical spine were also generated. COMPARISON:  None. FINDINGS: CT HEAD FINDINGS Brain: Thin acute right subdural hematoma in the right frontal region measures approximately 4 mm. No associated mass effect or midline shift. No subarachnoid or intraparenchymal hemorrhage. No hydrocephalus. No evidence of acute ischemia. Vascular: Generalized increased density of intravascular structures without hyperdense vessel. Skull: Left-sided skull fracture involving the left occipital and temporal bone, fracture extends through the mastoid air cells with opacification, fracture plane  not well characterized. Fracture extends through the skull base likely through the jugular foramen and carotid canal extending into the sphenoid sinus. Sinuses/Orbits: Left hemosinus related to skull base fracture. Opacification of left mastoid air cells related to temporal bone fracture. Undulation of left zygomatic arch likely remote injury, no acute fracture visualized. Mild right maxillary mucosal thickening. Other: Left occipital scalp hematoma. CT CERVICAL SPINE FINDINGS Alignment: Normal. Skull base and vertebrae: Skull base fracture extends through the left occipital and temporal bones and the left sphenoid sinus. No occipital condyle fracture. No cervical spine fracture. The dens is intact. Soft tissues and spinal canal: No prevertebral fluid or swelling. No visible canal hematoma. Disc levels: Diffuse disc space narrowing and endplate spurring and facet arthropathy. Modic endplate changes and vacuum phenomena at multiple levels. Upper chest: Mild emphysema.  No acute findings. Other: Carotid calcifications. IMPRESSION: 1. Acute thin right subdural hematoma measuring 4 mm without midline shift. 2. Left skull fracture involving the temporal and occipital bones extending through the mastoid air cells into the skull base. Fracture extends through the jugular foramen and carotid canal to the sphenoid sinus. Associated left hemosinus. Recommend head and neck CTA to evaluate for vascular injury. 3. Degenerative change in the cervical spine without cervical spine fracture. Critical Value/emergent results were called by telephone at the time of interpretation on 04/09/2018 at 2:24 am to Dr. Jaci Carrel , who verbally acknowledged these results. Electronically Signed   By: Narda Rutherford M.D.   On: 04/09/2018 02:26   Ct Angio Neck W And/or Wo Contrast  Result Date: 04/09/2018 CLINICAL DATA:  Trauma, skull fracture at high risk for arterial injury. EXAM: CT ANGIOGRAPHY HEAD AND NECK TECHNIQUE:  Multidetector CT imaging of the head and neck was performed using the standard protocol during bolus administration of intravenous contrast. Multiplanar CT image reconstructions and MIPs were obtained to evaluate the vascular anatomy. Carotid stenosis measurements (when applicable) are obtained utilizing NASCET criteria,  using the distal internal carotid diameter as the denominator. CONTRAST:  50mL ISOVUE-370 IOPAMIDOL (ISOVUE-370) INJECTION 76% COMPARISON:  CT HEAD and cervical spine April 09, 2018 at 0156 hours FINDINGS: CTA NECK FINDINGS: AORTIC ARCH: Normal appearance of the thoracic arch, normal branch pattern. Mild calcific atherosclerosis aortic arch. The origins of the innominate, left Common carotid artery and subclavian artery are widely patent. RIGHT CAROTID SYSTEM: Common carotid artery is patent. Moderate calcific atherosclerosis of the carotid bifurcation without hemodynamically significant stenosis by NASCET criteria. Normal appearance of the internal carotid artery. LEFT CAROTID SYSTEM: Common carotid artery is patent. Mild calcific atherosclerosis of the carotid bifurcation without hemodynamically significant stenosis by NASCET criteria. Normal appearance of the internal carotid artery. VERTEBRAL ARTERIES:Left vertebral artery is dominant. Patent vertebral arteries mild extrinsic compression due to degenerative cervical spine. SKELETON: No acute osseous process though bone windows have not been submitted. OTHER NECK: LEFT external auditory canal, middle ear and mastoid blood products. Known fracture better seen on today's CT. Associated gas tracking in the proximal LEFT neck. UPPER CHEST: Included lung apices are clear. Mild centrilobular emphysema. No superior mediastinal lymphadenopathy. CTA HEAD FINDINGS: ANTERIOR CIRCULATION: Patent cervical internal carotid arteries, petrous, cavernous and supra clinoid internal carotid arteries. Patent anterior communicating artery. Patent anterior and  middle cerebral arteries. No large vessel occlusion, significant stenosis, contrast extravasation or aneurysm. POSTERIOR CIRCULATION: Patent vertebral arteries, vertebrobasilar junction and basilar artery, as well as main branch vessels. Patent posterior cerebral arteries. No large vessel occlusion, significant stenosis, contrast extravasation or aneurysm. VENOUS SINUSES: Major dural venous sinuses are patent though not tailored for evaluation on this angiographic examination. ANATOMIC VARIANTS: None. DELAYED PHASE: No abnormal intracranial enhancement. MIP images reviewed. IMPRESSION: CTA NECK: 1. No acute vascular injury. 2. No hemodynamically significant stenosis ICA's. Patent vertebral arteries. CTA HEAD: 1. No acute vascular injury. 2. No acute large vessel occlusion or flow-limiting stenosis. Aortic Atherosclerosis (ICD10-I70.0). Emphysema (ICD10-J43.9). Electronically Signed   By: Awilda Metroourtnay  Bloomer M.D.   On: 04/09/2018 04:09   Ct Cervical Spine Wo Contrast  Result Date: 04/09/2018 CLINICAL DATA:  Fall tonight. Head trauma, CSF leak suspected; C-spine trauma, high clinical risk (NEXUS/CCR) EXAM: CT HEAD WITHOUT CONTRAST CT CERVICAL SPINE WITHOUT CONTRAST TECHNIQUE: Multidetector CT imaging of the head and cervical spine was performed following the standard protocol without intravenous contrast. Multiplanar CT image reconstructions of the cervical spine were also generated. COMPARISON:  None. FINDINGS: CT HEAD FINDINGS Brain: Thin acute right subdural hematoma in the right frontal region measures approximately 4 mm. No associated mass effect or midline shift. No subarachnoid or intraparenchymal hemorrhage. No hydrocephalus. No evidence of acute ischemia. Vascular: Generalized increased density of intravascular structures without hyperdense vessel. Skull: Left-sided skull fracture involving the left occipital and temporal bone, fracture extends through the mastoid air cells with opacification, fracture  plane not well characterized. Fracture extends through the skull base likely through the jugular foramen and carotid canal extending into the sphenoid sinus. Sinuses/Orbits: Left hemosinus related to skull base fracture. Opacification of left mastoid air cells related to temporal bone fracture. Undulation of left zygomatic arch likely remote injury, no acute fracture visualized. Mild right maxillary mucosal thickening. Other: Left occipital scalp hematoma. CT CERVICAL SPINE FINDINGS Alignment: Normal. Skull base and vertebrae: Skull base fracture extends through the left occipital and temporal bones and the left sphenoid sinus. No occipital condyle fracture. No cervical spine fracture. The dens is intact. Soft tissues and spinal canal: No prevertebral fluid or swelling. No visible canal hematoma. Disc  levels: Diffuse disc space narrowing and endplate spurring and facet arthropathy. Modic endplate changes and vacuum phenomena at multiple levels. Upper chest: Mild emphysema.  No acute findings. Other: Carotid calcifications. IMPRESSION: 1. Acute thin right subdural hematoma measuring 4 mm without midline shift. 2. Left skull fracture involving the temporal and occipital bones extending through the mastoid air cells into the skull base. Fracture extends through the jugular foramen and carotid canal to the sphenoid sinus. Associated left hemosinus. Recommend head and neck CTA to evaluate for vascular injury. 3. Degenerative change in the cervical spine without cervical spine fracture. Critical Value/emergent results were called by telephone at the time of interpretation on 04/09/2018 at 2:24 am to Dr. Jaci Carrel , who verbally acknowledged these results. Electronically Signed   By: Narda Rutherford M.D.   On: 04/09/2018 02:26       Subjective: Patient is feeling well, he was able to eat with good toleration, no nausea or vomiting, patient ambulated with physical therapy. No loss of balance.   Discharge  Exam: Vitals:   04/10/18 0713 04/10/18 1013  BP: (!) 184/99 (!) 145/69  Pulse: 72 88  Resp: 20   Temp: 98.1 F (36.7 C)   SpO2: 96%    Vitals:   04/10/18 0349 04/10/18 0627 04/10/18 0713 04/10/18 1013  BP: (!) 172/97 (!) 162/80 (!) 184/99 (!) 145/69  Pulse: 91  72 88  Resp: 20  20   Temp: 98.3 F (36.8 C)  98.1 F (36.7 C)   TempSrc: Oral  Oral   SpO2: 98%  96%   Weight:      Height:        General: Not in pain or dyspnea Neurology: Awake and alert, non focal  E ENT: no pallor, no icterus, oral mucosa moist Cardiovascular: No JVD. S1-S2 present, rhythmic, no gallops, rubs, or murmurs. No lower extremity edema. Pulmonary: vesicular breath sounds bilaterally, adequate air movement, no wheezing, rhonchi or rales. Gastrointestinal. Abdomen with no organomegaly, non tender, no rebound or guarding Skin. No rashes Musculoskeletal: no joint deformities   The results of significant diagnostics from this hospitalization (including imaging, microbiology, ancillary and laboratory) are listed below for reference.     Microbiology: No results found for this or any previous visit (from the past 240 hour(s)).   Labs: BNP (last 3 results) No results for input(s): BNP in the last 8760 hours. Basic Metabolic Panel: Recent Labs  Lab 04/09/18 0149 04/09/18 0751 04/10/18 0704  NA 138 140 139  K 3.2* 3.6 3.4*  CL 98 106 105  CO2 26 23 25   GLUCOSE 102* 113* 125*  BUN 11 10 8   CREATININE 0.76 0.60* 0.59*  CALCIUM 9.2 8.4* 9.0  MG  --  1.7  --    Liver Function Tests: Recent Labs  Lab 04/09/18 0149 04/09/18 0751  AST 21 20  ALT 16 15  ALKPHOS 67 61  BILITOT 0.5 0.4  PROT 6.9 6.3*  ALBUMIN 4.0 3.7   No results for input(s): LIPASE, AMYLASE in the last 168 hours. No results for input(s): AMMONIA in the last 168 hours. CBC: Recent Labs  Lab 04/09/18 0149 04/09/18 0751  WBC 7.8 9.8  NEUTROABS 4.3 7.8*  HGB 16.0 15.5  HCT 48.0 48.2  MCV 92.7 95.8  PLT 230 224    Cardiac Enzymes: No results for input(s): CKTOTAL, CKMB, CKMBINDEX, TROPONINI in the last 168 hours. BNP: Invalid input(s): POCBNP CBG: Recent Labs  Lab 04/09/18 0146 04/09/18 0234 04/09/18 1534  GLUCAP  108* 68* 92   D-Dimer No results for input(s): DDIMER in the last 72 hours. Hgb A1c No results for input(s): HGBA1C in the last 72 hours. Lipid Profile No results for input(s): CHOL, HDL, LDLCALC, TRIG, CHOLHDL, LDLDIRECT in the last 72 hours. Thyroid function studies No results for input(s): TSH, T4TOTAL, T3FREE, THYROIDAB in the last 72 hours.  Invalid input(s): FREET3 Anemia work up No results for input(s): VITAMINB12, FOLATE, FERRITIN, TIBC, IRON, RETICCTPCT in the last 72 hours. Urinalysis No results found for: COLORURINE, APPEARANCEUR, LABSPEC, PHURINE, GLUCOSEU, HGBUR, BILIRUBINUR, KETONESUR, PROTEINUR, UROBILINOGEN, NITRITE, LEUKOCYTESUR Sepsis Labs Invalid input(s): PROCALCITONIN,  WBC,  LACTICIDVEN Microbiology No results found for this or any previous visit (from the past 240 hour(s)).   Time coordinating discharge: 45 minutes  SIGNED:   Coralie Keens, MD  [facility] 04/10/2018, 2:20 PM Pager [phone]  If 7PM-7AM, please contact night-coverage [email or web address] Password [email or web address]

## 2018-04-10 NOTE — Care Management Note (Signed)
Case Management Note  Patient Details  Name: Martin Harrington MRN: [id number] Date of Birth: August 16, 1955  Subjective/Objective:      Pt admitted with a skull fracture after a fall. Pt states he lives with 2 roommates. He denies issues with transportation. [facility]. He states he has not been on medications at home but would get them through the [facility].              Action/Plan: Pt discharging home with his roommates. Pts brother to provide transportation. CM provided him coupon card to assist with the cost of his d/c medications until seen at the [facility] and they can assist with the cost.   Expected Discharge Date:  04/10/18               Expected Discharge Plan:  Home/Self Care  In-House Referral:     Discharge planning Services  CM Consult, Medication Assistance  Post Acute Care Choice:    Choice offered to:     DME Arranged:    DME Agency:     HH Arranged:    HH Agency:     Status of Service:  Completed, signed off  If discussed at [employer] Meetings, dates discussed:    Additional Comments:  Kermit BaloKelli F Earnestene Angello, RN 04/10/2018, 3:19 PM

## 2018-04-10 NOTE — Discharge Instructions (Signed)
Head Injury, Adult There are many types of head injuries. Head injuries can be as minor as a bump, or they can be more severe. More severe head injuries include:  A jarring injury to the brain (concussion).  A bruise of the brain (contusion). This means there is bleeding in the brain that can cause swelling.  A cracked skull (skull fracture).  Bleeding in the brain that collects, clots, and forms a bump (hematoma).  After a head injury, you may need to be observed for a while in the emergency department or urgent care. Sometimes admission to the hospital is needed. After a head injury has happened, most problems occur within the first 24 hours, but side effects may occur up to 7-10 days after the injury. It is important to watch your condition for any changes. What are the causes? There are many possible causes of a head injury. A serious head injury may happen to someone who is in a car accident (motor vehicle collision). Other causes of major head injuries include bicycle or motorcycle accidents, sports injuries, and falls. Risk factors This condition is more likely to occur in people who:  Drink a lot of alcohol or use drugs.  Are over the age of 53.  Are at risk for falls.  What are the symptoms? There are many possible symptoms of a head injury. Visible symptoms of a head injury include a bruise, bump, or bleeding at the site of the injury. Other non-visible symptoms include:  Feeling sleepy or not being able to stay awake.  Passing out.  Headache.  Seizures.  Dizziness.  Confusion.  Memory problems.  Nausea or vomiting.  Other possible symptoms that may develop after the head injury include:  Poor attention and concentration.  Fatigue or tiring easily.  Irritability.  Being uncomfortable around bright lights or loud noises.  Anxiety or depression.  Disturbed sleep.  How is this diagnosed? This condition can usually be diagnosed based on your symptoms, a  description of the injury, and a physical exam. You may also have imaging tests done, such as a CT scan or MRI. You will also be closely watched. How is this treated? Treatment for this condition depends on the severity and type of injury you have. The main goal of treatment is to prevent complications and allow the brain time to heal. For mild head injury, you may be sent home and treatment may include:  Observation. A responsible adult should stay with you for 24 hours after your injury and check on you often.  Physical rest.  Brain rest.  Pain medicines.  For severe brain injury, treatment may include:  Close observation. This includes hospitalization with frequent physical exams. You may need to go to a hospital that specializes in head injury.  Pain medicines.  Breathing support. This may include using a ventilator.  Managing the pressure inside the brain (intracranial pressure, or ICP). This may include: ? Monitoring the ICP. ? Giving medicines to decrease the ICP. ? Positioning you to decrease the ICP.  Medicine to prevent seizures.  Surgery to stop bleeding or to remove blood clots (craniotomy).  Surgery to remove part of the skull (decompressive craniectomy). This allows room for the brain to swell.  Follow these instructions at home: Activity  Rest as much as possible and avoid activities that are physically hard or tiring.  Make sure you get enough sleep.  Limit activities that require a lot of thought or attention, such as: ? Watching TV. ? Playing  memory games and puzzles. ? Job-related work or homework. ? Working on [place] computer, [employer] media, and texting.  Avoid activities that could cause another head injury, such as playing sports, until your health care provider approves. Having another head injury, especially before the first one has healed, can be dangerous.  Ask your health care provider when it is safe for you to return to your regular activities,  including work or school. Ask your health care provider for a step-by-step plan for gradually returning to activities.  Ask your health care provider when you can drive, ride a bicycle, or use heavy machinery. Your ability to react may be slower after a brain injury. Never do these activities if you are dizzy.  Lifestyle  Do not drink alcohol until your health care provider approves, and avoid drug use. Alcohol and certain drugs may slow your recovery and can put you at risk of further injury.  If it is harder than usual to remember things, write them down.  If you are easily distracted, try to do one thing at a time.  Talk with family members or close friends when making important decisions.  Tell your friends, family, a trusted colleague, and work Production designer, theatre/television/filmmanager about your injury, symptoms, and restrictions. Have them watch for any new or worsening problems.  General instructions  Take over-the-counter and prescription medicines only as told by your health care provider.  Have someone stay with you for 24 hours after your head injury. This person should watch you for any changes in your symptoms and be ready to seek medical help, as needed.  Keep all follow-up visits as told by your health care provider. This is important.  Prevention  Work on improving your balance and strength to avoid falls.  Wear a seatbelt when you are in a moving vehicle.  Wear a helmet when riding a bicycle, skiing, or doing any other sport or activity that has a risk of injury.  Drink alcohol only in moderation.  Take safety measures in your home, such as: ? Removing clutter and tripping hazards from floors and stairways. ? Using grab bars in bathrooms and handrails by stairs. ? Placing non-slip mats on floors and in bathtubs. ? Improving lighting in dim areas. Get help right away if:  You have: ? A severe headache that is not helped by medicine. ? Trouble walking, have weakness in your arms and legs, or  lose your balance. ? Clear or bloody fluid coming from your nose or ears. ? Changes in your vision. ? A seizure.  You vomit.  Your symptoms get worse.  Your speech is slurred.  You pass out.  You are sleepier and have trouble staying awake.  Your pupils change size. These symptoms may represent a serious problem that is an emergency. Do not wait to see if the symptoms will go away. Get medical help right away. Call your local emergency services (911 in the [place] Do not drive yourself to the hospital. This information is not intended to replace advice given to you by your health care provider. Make sure you discuss any questions you have with your health care provider. Document Released: 07/13/2005 Document Revised: 02/07/2016 Document Reviewed: 01/21/2016 Elsevier Interactive Patient Education  2017 [employer]   Intracranial Hemorrhage An intracranial hemorrhage is bleeding in the layers between the skull (cranium) and brain. A blood vessel bursts and allows blood to leak inside the cranial cavity. The leaking blood then collects (hematoma). This causes pressure and damage to brain  cells. The bleeding can be mild to severe. In severe cases, it can lead to permanent damage or death. Symptoms may come on suddenly or develop over time. Early diagnosis and treatment leads to better recovery. There are four types of intracranial hemorrhage: subarachnoid, subdural, extradural, or cerebral hemorrhage. What are the causes?  Head injury (trauma).  Ruptured brain aneurysm.  Bleeding from blood vessels that develop abnormally (arteriovenous malformation).  Bleeding disorder.  Use of blood thinners (anticoagulants).  Use of certain drugs, such as cocaine. For some people with intracranial hemorrhage, the cause is unknown. What increases the risk?  Using tobacco products, such as cigarettes and chewing tobacco.  Having high blood pressure (hypertension).  Abusing  alcohol.  Being a male, especially of postmenopausal age.  Having a family history of disease in the blood vessels of the brain (cerebrovascular disease).  Having certain genetic syndromes that result in kidney disease or connective tissue disease. What are the signs or symptoms?  A sudden, severe headache with no known cause. The headache is often described as the worst headache ever experienced.  Nausea or vomiting, especially when combined with other symptoms such as a headache.  Sudden weakness or numbness of the face, arm, or leg, especially on one side of the body.  Sudden trouble walking or difficulty moving arms or legs.  Sudden confusion.  Sudden personality changes.  Trouble speaking (aphasia) or understanding.  Difficulty swallowing.  Sudden trouble seeing in one or both eyes.  Double vision.  Dizziness.  Loss of balance or coordination.  Intolerance to light.  Stiff neck. How is this diagnosed? Your health care provider will perform a physical exam and ask about your symptoms. If an intracranial hemorrhage is suspected, various tests may be ordered. These tests may include:  A CT scan.  An MRI.  A cerebral angiogram.  A spinal tap (lumbar puncture).  Blood tests.  How is this treated? Immediate treatment in the hospital is often required to reduce the risk of brain damage. Treatment will depend on the cause of the bleeding, where it is located, and the extent of the bleeding and damage. The goals of treatment include stopping the bleeding, repairing the cause of bleeding, providing relief of symptoms, and preventing problems.  Medicines may be given to: ? Lower blood pressure (antihypertensives). ? Relieve pain (analgesics). ? Relieve nausea or vomiting.  Surgery may be needed to stop the bleeding, repair the cause of the bleeding, or remove the blood.  Rehabilitation may be needed to improve any cognitive and day-to-day functions impaired by  the condition.  Further treatment depends on the duration, severity, and cause of your symptoms. Physical, speech, and occupational therapists will assess you and work to improve any functions impaired by the intracranial hemorrhage. Measures will be taken to prevent short-term and long-term problems, including infection from breathing foreign material into the lungs (aspiration pneumonia), blood clots in the legs, bedsores, and falls. Follow these instructions at home:  Take medicines only as directed by your health care provider.  Eat healthy foods as directed by your health care provider: ? A diet low in salt (sodium), saturated fat, trans fat, and cholesterol may be recommended to manage your blood pressure. ? Foods may need to be soft or pureed, or small bites may need to be taken in order to avoid aspirating or choking. ? If studies show that your ability to swallow safely has been affected, you may need to seek help from specialists such as a dietitian, speech  and language pathologist, or an occupational therapist. These health care providers can teach you how to safely get the nutrition your body needs.  Rest and limit activities or movements as directed by your health care provider.  Do not use any tobacco products including cigarettes, chewing tobacco, or electronic cigarettes. If you need help quitting, ask your health care provider.  Limit alcohol intake to no more than 1 drink per day for nonpregnant women and 2 drinks per day for men. One drink equals 12 ounces of beer, 5 ounces of wine, or 1 ounces of hard liquor.  Make any other lifestyle changes as directed by your health care provider.  Monitor and record your blood pressure as directed by your health care provider.  A safe home environment is important to reduce the risk of falls. Your health care provider may arrange for specialists to evaluate your home. Having grab bars in the bedroom and bathroom is often important. Your  health care provider may arrange for special equipment to be used at home, such as raised toilets and a seat for the shower.  Do physical, occupational, and speech therapy as directed by your health care provider. Ongoing therapy may be needed to maximize your recovery.  Use a walker or a cane at all times if directed by your health care provider.  Keep all follow-up visits with your health care provider and other specialists. This is important. This includes any referrals, physical therapy, and rehabilitation. Get help right away if:  You have a sudden, severe headache with no known cause.  You have nausea or vomiting occurring with another symptom.  You have sudden weakness or numbness of the face, arm, or leg, especially on one side of the body.  You have sudden trouble walking or difficulty moving your arms or legs.  You have sudden confusion.  You have trouble speaking (aphasia) or understanding.  You have sudden trouble seeing in one or both eyes.  You have a sudden loss of balance or coordination.  You have a stiff neck.  You have difficulty breathing.  You have a partial or total loss of consciousness. These symptoms may represent a serious problem that is an emergency. Do not wait to see if the symptoms will go away. Get medical help right away. Call your local emergency services (911 in the [place] Do not drive yourself to the hospital. This information is not intended to replace advice given to you by your health care provider. Make sure you discuss any questions you have with your health care provider. Document Released: 02/07/2014 Document Revised: 12/13/2015 Document Reviewed: 09/06/2013 Elsevier Interactive Patient Education  [employer].

## 2018-04-10 NOTE — Progress Notes (Signed)
PROGRESS NOTE    Keane ScrapeJoel Evan Darco  BJY:[id number]:[id number] DOB: [birth year] DOA: 04/09/2018 PCP: Patient, No Pcp Per    Brief Narrative:  62 year old male who presented after a mechanical fall.  He does have significant past medical history for alcoholism.  He was found down on the floor below 2 steps at his home on a concrete floor with bleeding from his left ear and occipital area, apparently he had been drinking alcohol.  On his initial physical examination he had intermittent hallucinations, apparently he had a cerebrospinal fluid leak from his left ear.  Blood pressure 142/79, heart rate 71, respiratory rate 15, oxygen saturation 93%.  Moist mucous membranes, lungs clear to auscultation bilaterally, heart S1-S2 present rhythmic, abdomen soft nontender, no lower extremity.  Patient was nonfocal.  Sodium 138, potassium 3.2, chloride 98, bicarb 26, glucose 102, BUN 11, creatinine 0.76, white count 7.8, hemoglobin 16.0, hematocrit 48.0, platelets 230.  Alcohol level 353, chest radiograph with right rotation, no infiltrates.  Head CT with acute thin right subdural hematoma measuring 4 mm without midline shift.  Left skull fracture involving the temporal and occipital bones extending through the mastoid air cells into the skull base.  Fracture extends through the jugular foramen carotid canal to the sphenoid sinuses.  Associated left hemo-sinus.  EKG sinus rhythm, normal axis, poor R wave progression.  Patient was admitted to the hospital with a working diagnosis of traumatic left temporal, occipital bones and skull base fracture, in the setting of alcohol intoxication.    Assessment & Plan:   Principal Problem:   Skull fracture (HCC) Active Problems:   Subdural hematoma (HCC)   Alcohol abuse   Elevated blood pressure reading   1.Left temporal and occipital bones fracture; skull base fracture. Patient has not been out of bed or able to eat, continue to be at high risk for recurrent falls. Will follow  with physical therapy evaluation, encourage po intake. Continue pain control with ibuprofen and IV ketorolac, will avoid narcotics.   2. Right frontal 4 mm subdural hematoma. Continue neuro checks, will follow on physical therapy recommendation, patient continue to be a fall risk. Continue GI prophylaxis.   3. Alcohol intoxication. Patient has received IV benzodiazepines per CIWA protocol, for withdrawal symptoms, (3 mg). Continue thiamine and multivitamins.   4. HTN. Persistent uncontrolled hypertension, required IV hydralazine, will add HCTZ and continue monitoring.   5. Hypomagnsemia and hypokalemia due to dehydration. Continue K correction with oral Kcl, will follow on renal panel in am. Patient with very poor oral intake, will continue hydration with dextrose with Lactate Ringers at 75 ml per hour. Renal function with serum cr at 0.59  DVT prophylaxis: scd   Code Status:  full Family Communication: no family at the bedside  Disposition Plan/ discharge barriers: Patient continue to be high fall risk, requiring frequent neuro checks, IV fluids and IV hydralazine for uncontrolled HTN, will need more than 2 midnight stays, will change to inpatient.    Consultants:   Neurosurgery   Procedures:     Antimicrobials:       Subjective: Patient reports poor oral intake and has not ambulated yet. Persistent headache and fullness on his left ear. Not back to baseline. Had IV lorazepam per protocol overnight.   Objective: Vitals:   04/09/18 2340 04/10/18 0349 04/10/18 0627 04/10/18 0713  BP: (!) 162/90 (!) 172/97 (!) 162/80 (!) 184/99  Pulse: 92 91  72  Resp: 20 20  20   Temp: 98 F (36.7 C) 98.3  F (36.8 C)  98.1 F (36.7 C)  TempSrc: Oral Oral  Oral  SpO2: 98% 98%  96%  Weight:      Height:        Intake/Output Summary (Last 24 hours) at 04/10/2018 0917 Last data filed at 04/10/2018 0600 Gross per 24 hour  Intake 1305 ml  Output 300 ml  Net 1005 ml   Filed  Weights   04/09/18 0138 04/09/18 1919  Weight: 74.8 kg 74.8 kg    Examination:   General: deconditioned  Neurology: Awake and alert, non focal  E ENT: mild pallor, no icterus, oral mucosa moist Cardiovascular: No JVD. S1-S2 present, rhythmic, no gallops, rubs, or murmurs. No lower extremity edema. Pulmonary: positive breath sounds bilaterally, adequate air movement, no wheezing, rhonchi or rales. Gastrointestinal. Abdomen with, no organomegaly, non tender, no rebound or guarding Skin. No rashes Musculoskeletal: no joint deformities     Data Reviewed: I have personally reviewed following labs and imaging studies  CBC: Recent Labs  Lab 04/09/18 0149 04/09/18 0751  WBC 7.8 9.8  NEUTROABS 4.3 7.8*  HGB 16.0 15.5  HCT 48.0 48.2  MCV 92.7 95.8  PLT 230 224   Basic Metabolic Panel: Recent Labs  Lab 04/09/18 0149 04/09/18 0751 04/10/18 0704  NA 138 140 139  K 3.2* 3.6 3.4*  CL 98 106 105  CO2 26 23 25   GLUCOSE 102* 113* 125*  BUN 11 10 8   CREATININE 0.76 0.60* 0.59*  CALCIUM 9.2 8.4* 9.0  MG  --  1.7  --    GFR: Estimated Creatinine Clearance: 98.2 mL/min (A) (by C-G formula based on SCr of 0.59 mg/dL (L)). Liver Function Tests: Recent Labs  Lab 04/09/18 0149 04/09/18 0751  AST 21 20  ALT 16 15  ALKPHOS 67 61  BILITOT 0.5 0.4  PROT 6.9 6.3*  ALBUMIN 4.0 3.7   No results for input(s): LIPASE, AMYLASE in the last 168 hours. No results for input(s): AMMONIA in the last 168 hours. Coagulation Profile: No results for input(s): INR, PROTIME in the last 168 hours. Cardiac Enzymes: No results for input(s): CKTOTAL, CKMB, CKMBINDEX, TROPONINI in the last 168 hours. BNP (last 3 results) No results for input(s): PROBNP in the last 8760 hours. HbA1C: No results for input(s): HGBA1C in the last 72 hours. CBG: Recent Labs  Lab 04/09/18 0146 04/09/18 0234 04/09/18 1534  GLUCAP 108* 68* 92   Lipid Profile: No results for input(s): CHOL, HDL, LDLCALC, TRIG,  CHOLHDL, LDLDIRECT in the last 72 hours. Thyroid Function Tests: No results for input(s): TSH, T4TOTAL, FREET4, T3FREE, THYROIDAB in the last 72 hours. Anemia Panel: No results for input(s): VITAMINB12, FOLATE, FERRITIN, TIBC, IRON, RETICCTPCT in the last 72 hours.    Radiology Studies: I have reviewed all of the imaging during this hospital visit personally     Scheduled Meds: . ibuprofen  400 mg Oral TID  . LORazepam  0-4 mg Intravenous Q6H   Followed by  . [START ON 04/11/2018] LORazepam  0-4 mg Intravenous Q12H  . multivitamin with minerals  1 tablet Oral Daily  . pantoprazole  40 mg Oral Daily  . thiamine  100 mg Oral Daily   Or  . thiamine  100 mg Intravenous Daily   Continuous Infusions: . dextrose 5% lactated ringers 75 mL/hr at 04/10/18 0832     LOS: 0 days        Arlee Bossard Annett Gula, MD [facility] Pager [phone]

## 2018-04-11 ENCOUNTER — Encounter (HOSPITAL_COMMUNITY): Payer: Self-pay | Admitting: Emergency Medicine

## 2020-12-09 ENCOUNTER — Emergency Department (HOSPITAL_COMMUNITY)
Admission: EM | Admit: 2020-12-09 | Discharge: 2020-12-10 | Disposition: A | Discharge status: Home / Self Care | Payer: No Typology Code available for payment source | Attending: Emergency Medicine | Admitting: Emergency Medicine

## 2020-12-09 ENCOUNTER — Emergency Department (HOSPITAL_COMMUNITY): Payer: No Typology Code available for payment source

## 2020-12-09 DIAGNOSIS — F10929 Alcohol use, unspecified with intoxication, unspecified: Secondary | ICD-10-CM

## 2020-12-09 DIAGNOSIS — S0181XA Laceration without foreign body of other part of head, initial encounter: Secondary | ICD-10-CM

## 2020-12-09 DIAGNOSIS — S52502A Unspecified fracture of the lower end of left radius, initial encounter for closed fracture: Secondary | ICD-10-CM

## 2020-12-09 DIAGNOSIS — S0990XA Unspecified injury of head, initial encounter: Secondary | ICD-10-CM

## 2020-12-09 DIAGNOSIS — W19XXXA Unspecified fall, initial encounter: Secondary | ICD-10-CM | POA: Diagnosis not present

## 2020-12-09 DIAGNOSIS — Y908 Blood alcohol level of 240 mg/100 ml or more: Secondary | ICD-10-CM | POA: Diagnosis not present

## 2020-12-09 DIAGNOSIS — F10129 Alcohol abuse with intoxication, unspecified: Secondary | ICD-10-CM | POA: Diagnosis not present

## 2020-12-09 LAB — CBC WITH DIFFERENTIAL/PLATELET
Abs Immature Granulocytes: 0.04 10*3/uL (ref 0.00–0.07)
Basophils Absolute: 0.1 10*3/uL (ref 0.0–0.1)
Basophils Relative: 1 %
Eosinophils Absolute: 0.1 10*3/uL (ref 0.0–0.5)
Eosinophils Relative: 2 %
HCT: 45.3 % (ref 39.0–52.0)
Hemoglobin: 15.2 g/dL (ref 13.0–17.0)
Immature Granulocytes: 1 %
Lymphocytes Relative: 21 %
Lymphs Abs: 1.4 10*3/uL (ref 0.7–4.0)
MCH: 30.6 pg (ref 26.0–34.0)
MCHC: 33.6 g/dL (ref 30.0–36.0)
MCV: 91.3 fL (ref 80.0–100.0)
Monocytes Absolute: 0.6 10*3/uL (ref 0.1–1.0)
Monocytes Relative: 9 %
Neutro Abs: 4.5 10*3/uL (ref 1.7–7.7)
Neutrophils Relative %: 66 %
Platelets: 228 10*3/uL (ref 150–400)
RBC: 4.96 MIL/uL (ref 4.22–5.81)
RDW: 13 % (ref 11.5–15.5)
WBC: 6.6 10*3/uL (ref 4.0–10.5)
nRBC: 0 % (ref 0.0–0.2)

## 2020-12-09 LAB — RAPID URINE DRUG SCREEN, HOSP PERFORMED
Amphetamines: NOT DETECTED
Barbiturates: NOT DETECTED
Benzodiazepines: NOT DETECTED
Cocaine: NOT DETECTED
Opiates: NOT DETECTED
Tetrahydrocannabinol: POSITIVE — AB

## 2020-12-09 LAB — COMPREHENSIVE METABOLIC PANEL
ALT: 17 U/L (ref 0–44)
AST: 31 U/L (ref 15–41)
Albumin: 4 g/dL (ref 3.5–5.0)
Alkaline Phosphatase: 71 U/L (ref 38–126)
Anion gap: 13 (ref 5–15)
BUN: 11 mg/dL (ref 8–23)
CO2: 23 mmol/L (ref 22–32)
Calcium: 8.9 mg/dL (ref 8.9–10.3)
Chloride: 97 mmol/L — ABNORMAL LOW (ref 98–111)
Creatinine, Ser: 0.87 mg/dL (ref 0.61–1.24)
GFR, Estimated: >60 mL/min (ref >60)
Glucose, Bld: 107 mg/dL — ABNORMAL HIGH (ref 70–99)
Potassium: 3.4 mmol/L — ABNORMAL LOW (ref 3.5–5.1)
Sodium: 133 mmol/L — ABNORMAL LOW (ref 135–145)
Total Bilirubin: 0.5 mg/dL (ref 0.3–1.2)
Total Protein: 6.8 g/dL (ref 6.5–8.1)

## 2020-12-09 LAB — ACETAMINOPHEN LEVEL: Acetaminophen (Tylenol), Serum: <10 ug/mL — ABNORMAL LOW (ref 10–30)

## 2020-12-09 LAB — MAGNESIUM: Magnesium: 1.8 mg/dL (ref 1.7–2.4)

## 2020-12-09 LAB — ETHANOL: Alcohol, Ethyl (B): 317 mg/dL (ref <10)

## 2020-12-09 LAB — SALICYLATE LEVEL: Salicylate Lvl: <7 mg/dL — ABNORMAL LOW (ref 7.0–30.0)

## 2020-12-09 MED ORDER — FENTANYL CITRATE (PF) 100 MCG/2ML IJ SOLN
50.0000 ug | Freq: Once | INTRAMUSCULAR | Status: AC
  Administered 2020-12-10: 50 ug via INTRAVENOUS
  Filled 2020-12-09: qty 2

## 2020-12-09 MED ORDER — SODIUM CHLORIDE 0.9 % IV BOLUS
500.0000 mL | Freq: Once | INTRAVENOUS | Status: AC
  Administered 2020-12-09: 500 mL via INTRAVENOUS

## 2020-12-09 MED ORDER — THIAMINE HCL 100 MG/ML IJ SOLN
100.0000 mg | Freq: Once | INTRAMUSCULAR | Status: AC
  Administered 2020-12-09: 100 mg via INTRAVENOUS
  Filled 2020-12-09: qty 2

## 2020-12-09 NOTE — ED Notes (Signed)
This TRN at bedside for trauma. MD and primary RN also at bedside. Pt taken to CT by this TRN.

## 2020-12-09 NOTE — Discharge Instructions (Addendum)
Follow-up with hand surgery in the next few days. Use ice, Tylenol and ibuprofen as needed for pain. Return for new concerns.

## 2020-12-09 NOTE — ED Provider Notes (Signed)
[facility]ENC[facility] Provider Note   CSN: [id number] Arrival date & time: 12/09/20  2104     History No chief complaint on file.   Martin HoesJoel Oglesby is a 65 y.o. male.  Patient presents as a level 2 trauma after unwitnessed head injury and being confused.  Concern for intoxication due to heavy alcohol use and alcohol history.  Patient has high blood pressure history unknown any other medical history, no known blood thinner use.  Difficulty getting details due to intoxication.        No past medical history on file.  There are no problems to display for this patient.        No family history on file.     Home Medications Prior to Admission medications   Not on File    Allergies    Patient has no allergy information on record.  Review of Systems   Review of Systems  Unable to perform ROS: Mental status change    Physical Exam Updated Vital Signs BP 134/76   Pulse 78   Temp 98 F (36.7 C)   Resp 14   Ht 5\' 9"  (1.753 m)   Wt 77.1 kg   SpO2 93%   BMI 25.10 kg/m   Physical Exam Vitals and nursing note reviewed.  Constitutional:      Appearance: He is well-developed.  HENT:     Head: Normocephalic.     Comments: Patient is superficial laceration 1.5 cm right forehead no step-off mild hematoma.  C-collar in place. Eyes:     General:        Right eye: No discharge.        Left eye: No discharge.     Conjunctiva/sclera: Conjunctivae normal.  Neck:     Trachea: No tracheal deviation.  Cardiovascular:     Rate and Rhythm: Normal rate.  Pulmonary:     Effort: Pulmonary effort is normal.     Breath sounds: Normal breath sounds.  Abdominal:     General: There is no distension.     Palpations: Abdomen is soft.     Tenderness: There is no abdominal tenderness. There is no guarding.  Musculoskeletal:        General: Swelling, tenderness and deformity present.     Cervical back: Normal range of motion and neck supple.      Comments: Patient has no midline thoracic or lumbar tenderness.  Patient has mild tenderness and swelling dorsal distal left forearm and medial dorsal left hand.  Patient has flexion extension with discomfort.  No elbow or humerus tenderness.  Patient has no tenderness in lower extremities bilateral and right arm nontender.  Skin:    General: Skin is warm.  Neurological:     Mental Status: He is alert.     Motor: No weakness.     Comments: Patient clinically intoxicated, extraocular muscle function intact with nystagmus.  Psychiatric:     Comments: Patient will follow commands, clinically intoxicated, equal strength 5+ moving all extremities grossly flexion extension, pupils equal bilateral, horizontal eye movement intact with nystagmus bilateral horizontal.     ED Results / Procedures / Treatments   Labs (all labs ordered are listed, but only abnormal results are displayed) Labs Reviewed  COMPREHENSIVE METABOLIC PANEL - Abnormal; Notable for the following components:      Result Value   Sodium 133 (*)    Potassium 3.4 (*)    Chloride 97 (*)    Glucose, Bld 107 (*)  All other components within normal limits  ETHANOL - Abnormal; Notable for the following components:   Alcohol, Ethyl (B) 317 (*)    All other components within normal limits  ACETAMINOPHEN LEVEL - Abnormal; Notable for the following components:   Acetaminophen (Tylenol), Serum <10 (*)    All other components within normal limits  SALICYLATE LEVEL - Abnormal; Notable for the following components:   Salicylate Lvl <7.0 (*)    All other components within normal limits  RAPID URINE DRUG SCREEN, HOSP PERFORMED - Abnormal; Notable for the following components:   Tetrahydrocannabinol POSITIVE (*)    All other components within normal limits  CBC WITH DIFFERENTIAL/PLATELET  MAGNESIUM    EKG None  Radiology DG Forearm Left  Result Date: 12/09/2020 CLINICAL DATA:  Fall with pain EXAM: LEFT FOREARM - 2 VIEW  COMPARISON:  None. FINDINGS: Acute comminuted intra-articular distal radius fracture. Acute mildly displaced ulnar styloid process fracture. No radial head dislocation or elbow effusion IMPRESSION: 1. Acute comminuted intra-articular distal radius fracture 2. Acute ulnar styloid process fracture Electronically Signed   By: Jasmine Pang M.D.   On: 12/09/2020 22:14   DG Wrist Complete Left  Result Date: 12/09/2020 CLINICAL DATA:  Fall with pain EXAM: LEFT WRIST - COMPLETE 3+ VIEW COMPARISON:  None. FINDINGS: Acute nondisplaced ulnar styloid process fracture. Acute comminuted intra articular distal radius fracture with impaction and mild dorsal angulation and displacement. No subluxation IMPRESSION: 1. Acute nondisplaced ulnar styloid process fracture 2. Acute comminuted distal radius fracture with displacement and mild angulation Electronically Signed   By: Jasmine Pang M.D.   On: 12/09/2020 22:13   CT Head Wo Contrast  Result Date: 12/09/2020 CLINICAL DATA:  Intoxication with fall. EXAM: CT HEAD WITHOUT CONTRAST CT MAXILLOFACIAL WITHOUT CONTRAST CT CERVICAL SPINE WITHOUT CONTRAST TECHNIQUE: Multidetector CT imaging of the head, cervical spine, and maxillofacial structures were performed using the standard protocol without intravenous contrast. Multiplanar CT image reconstructions of the cervical spine and maxillofacial structures were also generated. COMPARISON:  April 09, 2018. FINDINGS: CT HEAD FINDINGS Brain: No evidence of acute infarction, hemorrhage, hydrocephalus, extra-axial collection or mass lesion/mass effect. Vascular: No hyperdense vessel. Atherosclerotic calcifications of the internal carotid arteries at skull base. Skull: Normal. Negative for fracture or focal lesion. Other: Study degraded by motion. CT MAXILLOFACIAL FINDINGS Osseous: No acute fracture or mandibular dislocation. Angulation of the nasal bone appears similar to CT April 09, 2018, related to prior trauma. Streak artifact  from dental hardware. Orbits: Orbits are grossly unremarkable. Sinuses: Mucosal thickening of the maxillary sinuses. The mastoid air cells are predominantly clear. Soft tissues: Negative.  Study is degraded by motion. CT CERVICAL SPINE FINDINGS Alignment: Preservation of the normal cervical lordosis. No evidence of traumatic listhesis. Skull base and vertebrae: No acute fracture. No primary bone lesion or focal pathologic process. Soft tissues and spinal canal: No prevertebral fluid or swelling. No visible canal hematoma. Disc levels: Multilevel degenerative changes spine with disc space narrowing prominent endplate spurring and facet/uncovertebral hypertrophy similar prior Upper chest: Emphysematous change. Other: Study degraded by motion. Calcifications of bilateral carotid arteries. IMPRESSION: Study is mildly degraded by motion artifact within that context: 1. No acute intracranial findings. 2. No evidence of acute facial bone fracture. 3. No evidence of acute fracture or traumatic listhesis of the cervical spine. 4. Stable multilevel degenerative changes of the cervical spine. Electronically Signed   By: Maudry Mayhew MD   On: 12/09/2020 22:10   CT Cervical Spine Wo Contrast  Result Date:  12/09/2020 CLINICAL DATA:  Intoxication with fall. EXAM: CT HEAD WITHOUT CONTRAST CT MAXILLOFACIAL WITHOUT CONTRAST CT CERVICAL SPINE WITHOUT CONTRAST TECHNIQUE: Multidetector CT imaging of the head, cervical spine, and maxillofacial structures were performed using the standard protocol without intravenous contrast. Multiplanar CT image reconstructions of the cervical spine and maxillofacial structures were also generated. COMPARISON:  April 09, 2018. FINDINGS: CT HEAD FINDINGS Brain: No evidence of acute infarction, hemorrhage, hydrocephalus, extra-axial collection or mass lesion/mass effect. Vascular: No hyperdense vessel. Atherosclerotic calcifications of the internal carotid arteries at skull base. Skull: Normal.  Negative for fracture or focal lesion. Other: Study degraded by motion. CT MAXILLOFACIAL FINDINGS Osseous: No acute fracture or mandibular dislocation. Angulation of the nasal bone appears similar to CT April 09, 2018, related to prior trauma. Streak artifact from dental hardware. Orbits: Orbits are grossly unremarkable. Sinuses: Mucosal thickening of the maxillary sinuses. The mastoid air cells are predominantly clear. Soft tissues: Negative.  Study is degraded by motion. CT CERVICAL SPINE FINDINGS Alignment: Preservation of the normal cervical lordosis. No evidence of traumatic listhesis. Skull base and vertebrae: No acute fracture. No primary bone lesion or focal pathologic process. Soft tissues and spinal canal: No prevertebral fluid or swelling. No visible canal hematoma. Disc levels: Multilevel degenerative changes spine with disc space narrowing prominent endplate spurring and facet/uncovertebral hypertrophy similar prior Upper chest: Emphysematous change. Other: Study degraded by motion. Calcifications of bilateral carotid arteries. IMPRESSION: Study is mildly degraded by motion artifact within that context: 1. No acute intracranial findings. 2. No evidence of acute facial bone fracture. 3. No evidence of acute fracture or traumatic listhesis of the cervical spine. 4. Stable multilevel degenerative changes of the cervical spine. Electronically Signed   By: Maudry Mayhew MD   On: 12/09/2020 22:10   DG Pelvis Portable  Result Date: 12/09/2020 CLINICAL DATA:  Recent fall with pelvic pain, initial encounter EXAM: PORTABLE PELVIS 1 VIEWS COMPARISON:  04/09/2018 FINDINGS: There is no evidence of pelvic fracture or diastasis. No pelvic bone lesions are seen. IMPRESSION: No acute abnormality noted. Electronically Signed   By: Alcide Clever M.D.   On: 12/09/2020 21:44   DG Chest Portable 1 View  Result Date: 12/09/2020 CLINICAL DATA:  Recent fall with head laceration, initial encounter EXAM: PORTABLE  CHEST 1 VIEW COMPARISON:  04/09/2018 FINDINGS: Cardiac shadow is within normal limits. Aortic calcifications are noted. Old healed rib fractures are noted bilaterally. No focal infiltrate or effusion is seen. Calcified pleural plaque is noted in the right base. IMPRESSION: Chronic changes without acute abnormality. Electronically Signed   By: Alcide Clever M.D.   On: 12/09/2020 21:42   CT Maxillofacial Wo Contrast  Result Date: 12/09/2020 CLINICAL DATA:  Intoxication with fall. EXAM: CT HEAD WITHOUT CONTRAST CT MAXILLOFACIAL WITHOUT CONTRAST CT CERVICAL SPINE WITHOUT CONTRAST TECHNIQUE: Multidetector CT imaging of the head, cervical spine, and maxillofacial structures were performed using the standard protocol without intravenous contrast. Multiplanar CT image reconstructions of the cervical spine and maxillofacial structures were also generated. COMPARISON:  April 09, 2018. FINDINGS: CT HEAD FINDINGS Brain: No evidence of acute infarction, hemorrhage, hydrocephalus, extra-axial collection or mass lesion/mass effect. Vascular: No hyperdense vessel. Atherosclerotic calcifications of the internal carotid arteries at skull base. Skull: Normal. Negative for fracture or focal lesion. Other: Study degraded by motion. CT MAXILLOFACIAL FINDINGS Osseous: No acute fracture or mandibular dislocation. Angulation of the nasal bone appears similar to CT April 09, 2018, related to prior trauma. Streak artifact from dental hardware. Orbits: Orbits are grossly unremarkable.  Sinuses: Mucosal thickening of the maxillary sinuses. The mastoid air cells are predominantly clear. Soft tissues: Negative.  Study is degraded by motion. CT CERVICAL SPINE FINDINGS Alignment: Preservation of the normal cervical lordosis. No evidence of traumatic listhesis. Skull base and vertebrae: No acute fracture. No primary bone lesion or focal pathologic process. Soft tissues and spinal canal: No prevertebral fluid or swelling. No visible canal  hematoma. Disc levels: Multilevel degenerative changes spine with disc space narrowing prominent endplate spurring and facet/uncovertebral hypertrophy similar prior Upper chest: Emphysematous change. Other: Study degraded by motion. Calcifications of bilateral carotid arteries. IMPRESSION: Study is mildly degraded by motion artifact within that context: 1. No acute intracranial findings. 2. No evidence of acute facial bone fracture. 3. No evidence of acute fracture or traumatic listhesis of the cervical spine. 4. Stable multilevel degenerative changes of the cervical spine. Electronically Signed   By: Maudry Mayhew MD   On: 12/09/2020 22:10    Procedures Procedures   Medications Ordered in ED Medications  fentaNYL (SUBLIMAZE) injection 50 mcg (has no administration in time range)  sodium chloride 0.9 % bolus 500 mL (500 mLs Intravenous New Bag/Given 12/09/20 2155)  thiamine (B-1) injection 100 mg (100 mg Intravenous Given 12/09/20 2201)    ED Course  I have reviewed the triage vital signs and the nursing notes.  Pertinent labs & imaging results that were available during my care of the patient were reviewed by me and considered in my medical decision making (see chart for details).    MDM Rules/Calculators/A&P                          Patient presents clinically intoxicated, GCS 14 and unwitnessed head injury.  Due to intoxication patient high risk, level 2 trauma concerning for traumatic brain injury which may include epidural, subdural, seizure, traumatic intraparenchymal hemorrhage, other.  Other differentials include metabolic, alcohol-related, tox related.  No gross evidence of infection on exam.  Patient assessed initially, difficult exam however no other signs of significant injury except for head neck and face.  Plan for portable x-rays, blood work, thiamine, IV fluids, blood work pending.  CT scan head neck ordered and face.  Blood work reviewed showing alcohol level 317, sodium  133, IV fluid bolus ordered. X-rays reviewed chest x-ray no acute abnormalities no pneumothorax, left forearm x-ray revealed distal ulna and radius fracture with mild angulation.  With clinical intoxication and minimal angulation decision for splint to be placed after IV removed.  IV pain meds given.  Plan to transition oral meds remove IV and placed splint.  Patient will be observed until clinically not intoxicated and safe ride home in the morning to follow-up with orthopedics and primary doctor.    Final Clinical Impression(s) / ED Diagnoses Final diagnoses:  Alcoholic intoxication with complication (HCC)  Acute head injury, initial encounter  Facial laceration, initial encounter  Closed fracture of distal end of left radius, unspecified fracture morphology, initial encounter    Rx / DC Orders ED Discharge Orders    None       Blane Ohara, MD 12/09/20 2333
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
## 2020-12-09 NOTE — Consult Note (Signed)
Responded to page, pt unavailable, no family present, staff will call again if further chaplain services needed.   Rev. Phylis Javed' Chaplain 

## 2020-12-10 ENCOUNTER — Encounter (HOSPITAL_COMMUNITY): Payer: Self-pay | Admitting: Emergency Medicine

## 2020-12-10 MED ORDER — OXYCODONE-ACETAMINOPHEN 5-325 MG PO TABS
1.0000 | ORAL_TABLET | Freq: Once | ORAL | Status: AC
  Administered 2020-12-10: 1 via ORAL
  Filled 2020-12-10: qty 1

## 2020-12-10 NOTE — ED Notes (Signed)
Ortho tech at bedside 
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
## 2020-12-10 NOTE — ED Notes (Signed)
Pt verbalizes understanding of d/c instructions. Pt taken to lobby at d/c with all belongings. Brother on way to pick pt up.

## 2020-12-10 NOTE — Progress Notes (Signed)
Orthopedic Tech Progress Note Patient Details:  Martin Harrington 06/12/1956 [id number]  Ortho Devices Type of Ortho Device: Arm sling,Sugartong splint Ortho Device/Splint Location: lue Ortho Device/Splint Interventions: Ordered,Application,Adjustment   Post Interventions Patient Tolerated: Well Instructions Provided: Care of device,Adjustment of device   Trinna Post 12/10/2020, 4:54 AM

## 2020-12-13 ENCOUNTER — Encounter (HOSPITAL_COMMUNITY): Payer: Self-pay | Admitting: Orthopedic Surgery

## 2020-12-13 ENCOUNTER — Other Ambulatory Visit: Payer: Self-pay

## 2020-12-13 DIAGNOSIS — S52502A Unspecified fracture of the lower end of left radius, initial encounter for closed fracture: Secondary | ICD-10-CM

## 2020-12-13 NOTE — H&P (Signed)
   Martin Harrington is an 65 y.o. male.   Chief Complaint: LEFT WRIST INJURY  HPI: The patient is a 64y/o right hand dominant male who fell on 12/10/20 landing on the left side. He was evaluated in the emergency department where he was put into a sugar tong splint.  He was seen in our office for further treatment. He continues with swelling, stiffness, and pain. Discussed the reason for surgical intervention.  He has been using a wrist brace and taking Tylenol for pain.  He is here today for surgery.  He denies chest pain, shortness of breath, fever, chills, nausea, vomiting, or diarrhea.    Past Medical History:  Diagnosis Date  . Alcohol abuse   . Hypertension     No past surgical history on file.  Family History  Problem Relation Age of Onset  . CAD Neg Hx   . Diabetes Mellitus II Neg Hx    Social History:  reports that he has been smoking. He has been smoking about 1.00 pack per day. He has never used smokeless tobacco. He reports current alcohol use. He reports previous drug use.  Allergies: No Known Allergies  No medications prior to admission.    No results found for this or any previous visit (from the past 48 hour(s)). No results found.  ROS NO RECENT ILLNESSES OR HOSPITALIZATIONS  There were no vitals taken for this visit. Physical Exam  General Appearance:  Alert, cooperative, no distress, appears stated age  Head:  Normocephalic, without obvious abnormality, atraumatic  Eyes:  Pupils equal, conjunctiva/corneas clear,         Throat: Lips, mucosa, and tongue normal; teeth and gums normal  Neck: No visible masses     Lungs:   respirations unlabored  Chest Wall:  No tenderness or deformity  Heart:  Regular rate and rhythm,  Abdomen:   Soft, non-tender,         Extremities: LUE - MODERATE SWELLING OF THE HAND AND WRIST WITHOUT ERYTHEMA OR OPEN WOUNDS. SENSATION INTACT TO LIGHT TOUCH. CAPILLARY REFILL LESS THAN 2 SECONDS. TENDERNESS TO PALPATION OF THE DISTAL  RADIUS AND ULNA. ABLE TO GENTLY WIGGLE DIGITS.   Pulses: 2+ and symmetric  Skin: Skin color, texture, turgor normal, no rashes or lesions     Neurologic: Normal    Assessment/Plan LEFT DISTAL RADIUS DISPLACED FRACTURE   - LEFT DISTAL RADIUS OPEN REDUCTION AND INTERNAL FIXATION WITH REPAIR AS INDICATED  R/B/A DISCUSSED WITH PT IN OFFICE.  PT VOICED UNDERSTANDING OF PLAN CONSENT SIGNED DAY OF SURGERY PT SEEN AND EXAMINED PRIOR TO OPERATIVE PROCEDURE/DAY OF SURGERY SITE MARKED. QUESTIONS ANSWERED WILL GO HOME FOLLOWING SURGERY   WE ARE PLANNING SURGERY FOR YOUR UPPER EXTREMITY. THE RISKS AND BENEFITS OF SURGERY INCLUDE BUT NOT LIMITED TO BLEEDING INFECTION, DAMAGE TO NEARBY NERVES ARTERIES TENDONS, FAILURE OF SURGERY TO ACCOMPLISH ITS INTENDED GOALS, PERSISTENT SYMPTOMS AND NEED FOR FURTHER SURGICAL INTERVENTION. WITH THIS IN MIND WE WILL PROCEED. I HAVE DISCUSSED WITH THE PATIENT THE PRE AND POSTOPERATIVE REGIMEN AND THE DOS AND DON'TS. PT VOICED UNDERSTANDING AND INFORMED CONSENT SIGNED.  Lorree Millar [facility] MD 12/14/20  Karma Greaser 12/13/2020, 3:15 PM

## 2020-12-14 ENCOUNTER — Ambulatory Visit (HOSPITAL_COMMUNITY): Payer: No Typology Code available for payment source | Admitting: Certified Registered Nurse Anesthetist

## 2020-12-14 ENCOUNTER — Other Ambulatory Visit: Payer: Self-pay

## 2020-12-14 ENCOUNTER — Encounter (HOSPITAL_COMMUNITY)
Admission: RE | Disposition: A | Discharge status: Home / Self Care | Payer: Self-pay | Source: Ambulatory Visit | Attending: Orthopedic Surgery

## 2020-12-14 ENCOUNTER — Ambulatory Visit (HOSPITAL_COMMUNITY)
Admission: RE | Admit: 2020-12-14 | Discharge: 2020-12-14 | Disposition: A | Discharge status: Home / Self Care | Payer: No Typology Code available for payment source | Source: Ambulatory Visit | Attending: Orthopedic Surgery | Admitting: Orthopedic Surgery

## 2020-12-14 ENCOUNTER — Encounter (HOSPITAL_COMMUNITY): Payer: Self-pay | Admitting: Orthopedic Surgery

## 2020-12-14 ENCOUNTER — Ambulatory Visit (HOSPITAL_COMMUNITY): Payer: No Typology Code available for payment source

## 2020-12-14 DIAGNOSIS — S52572A Other intraarticular fracture of lower end of left radius, initial encounter for closed fracture: Secondary | ICD-10-CM | POA: Diagnosis not present

## 2020-12-14 DIAGNOSIS — F17210 Nicotine dependence, cigarettes, uncomplicated: Secondary | ICD-10-CM | POA: Diagnosis not present

## 2020-12-14 DIAGNOSIS — W19XXXA Unspecified fall, initial encounter: Secondary | ICD-10-CM | POA: Insufficient documentation

## 2020-12-14 DIAGNOSIS — S52502A Unspecified fracture of the lower end of left radius, initial encounter for closed fracture: Secondary | ICD-10-CM

## 2020-12-14 HISTORY — DX: Gastro-esophageal reflux disease without esophagitis: K21.9

## 2020-12-14 HISTORY — DX: Anxiety disorder, unspecified: F41.9

## 2020-12-14 HISTORY — PX: OPEN REDUCTION INTERNAL FIXATION (ORIF) DISTAL RADIAL FRACTURE: SHX5989

## 2020-12-14 HISTORY — DX: Unspecified osteoarthritis, unspecified site: M19.90

## 2020-12-14 SURGERY — OPEN REDUCTION INTERNAL FIXATION (ORIF) DISTAL RADIUS FRACTURE
Anesthesia: Monitor Anesthesia Care | Laterality: Left

## 2020-12-14 MED ORDER — FENTANYL CITRATE (PF) 250 MCG/5ML IJ SOLN
INTRAMUSCULAR | Status: DC | PRN
  Administered 2020-12-14 (×2): 25 ug via INTRAVENOUS
  Administered 2020-12-14: 50 ug via INTRAVENOUS
  Administered 2020-12-14 (×2): 25 ug via INTRAVENOUS

## 2020-12-14 MED ORDER — ONDANSETRON HCL 4 MG/2ML IJ SOLN
INTRAMUSCULAR | Status: AC
  Filled 2020-12-14: qty 2

## 2020-12-14 MED ORDER — CHLORHEXIDINE GLUCONATE 0.12 % MT SOLN
OROMUCOSAL | Status: AC
  Administered 2020-12-14: 15 mL via OROMUCOSAL
  Filled 2020-12-14: qty 15

## 2020-12-14 MED ORDER — HYDROCODONE-ACETAMINOPHEN 5-325 MG PO TABS: 1.0000 | ORAL_TABLET | ORAL | 0 refills | Status: AC | PRN

## 2020-12-14 MED ORDER — LIDOCAINE 2% (20 MG/ML) 5 ML SYRINGE
INTRAMUSCULAR | Status: DC | PRN
  Administered 2020-12-14: 60 mg via INTRAVENOUS

## 2020-12-14 MED ORDER — CHLORHEXIDINE GLUCONATE 0.12 % MT SOLN: 15.0000 mL | Freq: Once | OROMUCOSAL | Status: AC

## 2020-12-14 MED ORDER — PROPOFOL 500 MG/50ML IV EMUL
INTRAVENOUS | Status: DC | PRN
  Administered 2020-12-14: 150 ug/kg/min via INTRAVENOUS

## 2020-12-14 MED ORDER — LACTATED RINGERS IV SOLN: INTRAVENOUS | Status: DC

## 2020-12-14 MED ORDER — ORAL CARE MOUTH RINSE: 15.0000 mL | Freq: Once | OROMUCOSAL | Status: AC

## 2020-12-14 MED ORDER — MIDAZOLAM HCL 2 MG/2ML IJ SOLN
INTRAMUSCULAR | Status: DC | PRN
  Administered 2020-12-14: 2 mg via INTRAVENOUS

## 2020-12-14 MED ORDER — 0.9 % SODIUM CHLORIDE (POUR BTL) OPTIME
TOPICAL | Status: DC | PRN
  Administered 2020-12-14: 1000 mL

## 2020-12-14 MED ORDER — ONDANSETRON HCL 4 MG/2ML IJ SOLN
INTRAMUSCULAR | Status: DC | PRN
  Administered 2020-12-14: 4 mg via INTRAVENOUS

## 2020-12-14 MED ORDER — ROPIVACAINE HCL 5 MG/ML IJ SOLN
INTRAMUSCULAR | Status: DC | PRN
  Administered 2020-12-14: 15 mL via PERINEURAL

## 2020-12-14 MED ORDER — PROPOFOL 10 MG/ML IV BOLUS
INTRAVENOUS | Status: AC
  Filled 2020-12-14: qty 20

## 2020-12-14 MED ORDER — CEFAZOLIN SODIUM-DEXTROSE 2-4 GM/100ML-% IV SOLN
2.0000 g | INTRAVENOUS | Status: AC
  Administered 2020-12-14: 2 g via INTRAVENOUS
  Filled 2020-12-14: qty 100

## 2020-12-14 MED ORDER — LIDOCAINE-EPINEPHRINE (PF) 1.5 %-1:200000 IJ SOLN
INTRAMUSCULAR | Status: DC | PRN
  Administered 2020-12-14: 15 mL via PERINEURAL

## 2020-12-14 MED ORDER — PHENYLEPHRINE 40 MCG/ML (10ML) SYRINGE FOR IV PUSH (FOR BLOOD PRESSURE SUPPORT)
PREFILLED_SYRINGE | INTRAVENOUS | Status: AC
  Filled 2020-12-14: qty 10

## 2020-12-14 MED ORDER — MIDAZOLAM HCL 2 MG/2ML IJ SOLN
INTRAMUSCULAR | Status: AC
  Filled 2020-12-14: qty 2

## 2020-12-14 MED ORDER — FENTANYL CITRATE (PF) 250 MCG/5ML IJ SOLN
INTRAMUSCULAR | Status: AC
  Filled 2020-12-14: qty 5

## 2020-12-14 SURGICAL SUPPLY — 54 items
BIT DRILL 2.2 SS TIBIAL (BIT) ×2 IMPLANT
BNDG ELASTIC 3X5.8 VLCR STR LF (GAUZE/BANDAGES/DRESSINGS) ×2 IMPLANT
BNDG ELASTIC 4X5.8 VLCR STR LF (GAUZE/BANDAGES/DRESSINGS) ×2 IMPLANT
BNDG ESMARK 4X9 LF (GAUZE/BANDAGES/DRESSINGS) ×2 IMPLANT
BNDG GAUZE ELAST 4 BULKY (GAUZE/BANDAGES/DRESSINGS) ×2 IMPLANT
CANISTER SUCT 3000ML PPV (MISCELLANEOUS) ×2 IMPLANT
CORD BIPOLAR FORCEPS 12FT (ELECTRODE) ×2 IMPLANT
COVER SURGICAL LIGHT HANDLE (MISCELLANEOUS) ×2 IMPLANT
CUFF TOURN SGL QUICK 18X4 (TOURNIQUET CUFF) ×2 IMPLANT
DRAPE OEC MINIVIEW 54X84 (DRAPES) ×2 IMPLANT
DRAPE SURG 17X11 SM STRL (DRAPES) ×2 IMPLANT
DRIVER BIT SQUARE 1.7/2.2 (TRAUMA) ×2 IMPLANT
DRSG ADAPTIC 3X8 NADH LF (GAUZE/BANDAGES/DRESSINGS) ×2 IMPLANT
GAUZE SPONGE 4X4 12PLY STRL (GAUZE/BANDAGES/DRESSINGS) ×2 IMPLANT
GAUZE XEROFORM 5X9 LF (GAUZE/BANDAGES/DRESSINGS) IMPLANT
GLOVE BIOGEL PI IND STRL 8.5 (GLOVE) ×1 IMPLANT
GLOVE BIOGEL PI INDICATOR 8.5 (GLOVE) ×1
GLOVE SURG ORTHO 8.0 STRL STRW (GLOVE) ×2 IMPLANT
GOWN STRL REUS W/ TWL LRG LVL3 (GOWN DISPOSABLE) ×1 IMPLANT
GOWN STRL REUS W/ TWL XL LVL3 (GOWN DISPOSABLE) ×1 IMPLANT
GOWN STRL REUS W/TWL LRG LVL3 (GOWN DISPOSABLE) ×2
GOWN STRL REUS W/TWL XL LVL3 (GOWN DISPOSABLE) ×2
K-WIRE 1.6 (WIRE) ×2
K-WIRE FX5X1.6XNS BN SS (WIRE) ×1
KIT BASIN OR (CUSTOM PROCEDURE TRAY) ×2 IMPLANT
KIT TURNOVER KIT B (KITS) ×2 IMPLANT
KWIRE FX5X1.6XNS BN SS (WIRE) ×1 IMPLANT
NS IRRIG 1000ML POUR BTL (IV SOLUTION) ×2 IMPLANT
PACK ORTHO EXTREMITY (CUSTOM PROCEDURE TRAY) ×2 IMPLANT
PAD ARMBOARD 7.5X6 YLW CONV (MISCELLANEOUS) ×2 IMPLANT
PAD CAST 3X4 CTTN HI CHSV (CAST SUPPLIES) ×2 IMPLANT
PAD CAST 4YDX4 CTTN HI CHSV (CAST SUPPLIES) IMPLANT
PADDING CAST COTTON 3X4 STRL (CAST SUPPLIES) ×4
PADDING CAST COTTON 4X4 STRL (CAST SUPPLIES)
PEG LOCKING SMOOTH 2.2X18 (Peg) ×4 IMPLANT
PEG LOCKING SMOOTH 2.2X20 (Screw) ×4 IMPLANT
PEG LOCKING SMOOTH 2.2X24 (Peg) ×4 IMPLANT
PEG LOCKING SMOOTH 2.2X26 (Peg) ×2 IMPLANT
PLATE STANDARD DVR LEFT (Plate) ×2 IMPLANT
PLATE STD DVR LT 24X51 (Plate) ×1 IMPLANT
SCREW LOCK 16X2.7X 3 LD TPR (Screw) ×4 IMPLANT
SCREW LOCKING 2.7X15MM (Screw) ×2 IMPLANT
SCREW LOCKING 2.7X16 (Screw) ×8 IMPLANT
SCREW MULTI DIRECTIONAL 2.7X20 (Screw) ×2 IMPLANT
SLING ARM FOAM STRAP LRG (SOFTGOODS) ×2 IMPLANT
SOAP 2 % CHG 4 OZ (WOUND CARE) ×2 IMPLANT
SPLINT FIBERGLASS 3X35 (CAST SUPPLIES) ×2 IMPLANT
SUT MNCRL AB 3-0 PS2 27 (SUTURE) ×2 IMPLANT
SUT PROLENE 3 0 PS 1 (SUTURE) ×2 IMPLANT
SUT VIC AB 2-0 CT1 27 (SUTURE) ×2
SUT VIC AB 2-0 CT1 TAPERPNT 27 (SUTURE) ×1 IMPLANT
TOWEL GREEN STERILE (TOWEL DISPOSABLE) ×2 IMPLANT
TUBE CONNECTING 12X1/4 (SUCTIONS) ×2 IMPLANT
WATER STERILE IRR 1000ML POUR (IV SOLUTION) ×2 IMPLANT

## 2020-12-14 NOTE — Anesthesia Procedure Notes (Addendum)
Anesthesia Regional Block: Supraclavicular block   Pre-Anesthetic Checklist: ,, timeout performed, Correct Patient, Correct Site, Correct Laterality, Correct Procedure, Correct Position, site marked, Risks and benefits discussed,  Surgical consent,  Pre-op evaluation,  At surgeon's request and post-op pain management  Laterality: Left  Prep: Dura Prep       Needles:  Injection technique: Single-shot  Needle Type: Echogenic Stimulator Needle     Needle Length: 5cm  Needle Gauge: 20     Additional Needles:   Procedures:,,,, ultrasound used (permanent image in chart),,,,  Narrative:  Start time: 12/14/2020 3:50 PM End time: 12/14/2020 3:52 PM Injection made incrementally with aspirations every 5 mL.  Performed by: Personally  Anesthesiologist: Atilano Median, DO  Additional Notes: Patient identified. Risks/Benefits/Options discussed with patient including but not limited to bleeding, infection, nerve damage, failed block, incomplete pain control. Patient expressed understanding and wished to proceed. All questions were answered. Sterile technique was used throughout the entire procedure. Please see nursing notes for vital signs. Aspirated in 5cc intervals with injection for negative confirmation. Patient was given instructions on fall risk and not to get out of bed. All questions and concerns addressed with instructions to call with any issues or inadequate analgesia.

## 2020-12-14 NOTE — Discharge Instructions (Signed)
KEEP BANDAGE CLEAN AND DRY CALL OFFICE FOR F/U APPT [phone] IN 13 DAYS RX SENT TO [facility] KEEP HAND ELEVATED ABOVE HEART OK TO APPLY ICE TO OPERATIVE AREA CONTACT OFFICE IF ANY WORSENING PAIN OR CONCERNS.

## 2020-12-14 NOTE — Anesthesia Preprocedure Evaluation (Signed)
Anesthesia Evaluation  Patient identified by MRN, date of birth, ID band Patient awake    Reviewed: Allergy & Precautions, NPO status , Patient's Chart, lab work & pertinent test results  Airway Mallampati: II  TM Distance: >3 FB Neck ROM: Full    Dental  (+) Teeth Intact   Pulmonary neg pulmonary ROS, Current Smoker,    Pulmonary exam normal        Cardiovascular hypertension, Pt. on medications  Rhythm:Regular Rate:Normal     Neuro/Psych Anxiety negative neurological ROS     GI/Hepatic Neg liver ROS, GERD  Medicated,  Endo/Other  negative endocrine ROS  Renal/GU negative Renal ROS  negative genitourinary   Musculoskeletal  (+) Arthritis , Left distal radius fx   Abdominal (+)  Abdomen: soft. Bowel sounds: normal.  Peds  Hematology negative hematology ROS (+)   Anesthesia Other Findings   Reproductive/Obstetrics                             Anesthesia Physical Anesthesia Plan  ASA: II  Anesthesia Plan: MAC and Regional   Post-op Pain Management:  Regional for Post-op pain   Induction: Intravenous  PONV Risk Score and Plan: Ondansetron, Propofol infusion, Treatment may vary due to age or medical condition and Midazolam  Airway Management Planned: Simple Face Mask, Natural Airway and Nasal Cannula  Additional Equipment: None  Intra-op Plan:   Post-operative Plan: Extubation in OR  Informed Consent: I have reviewed the patients History and Physical, chart, labs and discussed the procedure including the risks, benefits and alternatives for the proposed anesthesia with the patient or authorized representative who has indicated his/her understanding and acceptance.     Dental advisory given  Plan Discussed with: CRNA  Anesthesia Plan Comments:         Anesthesia Quick Evaluation

## 2020-12-14 NOTE — Transfer of Care (Signed)
Immediate Anesthesia Transfer of Care Note  Patient: Martin Harrington  Procedure(s) Performed: LEFT DISTAL RADIUS OPEN REDUCTION INTERNAL FIXATION (ORIF) WITH REPAIR AS INDICATED (Left )  Patient Location: PACU  Anesthesia Type:MAC and Regional  Level of Consciousness: awake, alert  and oriented  Airway & Oxygen Therapy: Patient Spontanous Breathing  Post-op Assessment: Report given to RN and Post -op Vital signs reviewed and stable  Post vital signs: Reviewed and stable  Last Vitals:  Vitals Value Taken Time  BP 165/89 12/14/20 1711  Temp    Pulse 88 12/14/20 1711  Resp 17 12/14/20 1711  SpO2 95 % 12/14/20 1711  Vitals shown include unvalidated device data.  Last Pain:  Vitals:   12/14/20 1538  TempSrc: Oral  PainSc: 1          Complications: No complications documented.

## 2020-12-14 NOTE — Op Note (Signed)
PREOPERATIVE DIAGNOSIS:Left wrist intra-articular distal radius fracture  POSTOPERATIVE DIAGNOSIS:Same  ATTENDING SURGEON:Dr. Bradly Bienenstock who was scrubbed and present for entire procedure  ASSISTANT SURGEON:None  ANESTHESIA:Regional with iv sedation  OPERATIVE PROCEDURE: Open treatment of left intra-articular distal radius fracture of 3 or more fragments Left wrist brachioradialis tendon tenotomy and release Left wrist 3 view radiographs  IMPLANTS: [employer] DVR cross lock standard  SMO:[id number]  RADIOGRAPHIC INTERPRETATION: AP lateral and oblique views of the wrist do show the volar plate fixation in place with good alignment of the radiocarpal and distal radial ulnar joint  SURGICAL INDICATIONS: Patient is a right-hand-dominant gentleman who sustained the injury to his left wrist.  Patient was seen evaluate the office and recommended undergo the above procedure.  Risks of surgery include but not limited to bleeding infection damage nearby nerves arteries or tendons nonunion malunion hardware failure loss of motion of the wrist and digits incomplete relief of symptoms and need for further surgical invention.  SURGICAL TECHNIQUE: The patient was prepped identified in the preoperative holding area marked the permanent marker made the left wrist indicate correct operative site.  Patient brought back to operating placed supine on anesthesia table where the regional anesthetic had been administered.  Patient tolerates well.  A well-padded tourniquet placed on the left brachium and seal with the appropriate drape.  Left upper extremities then prepped and draped normal sterile fashion.  Preoperative antibiotics were given prior any skin incision.  A longitudinal incision was then made directly over the FCR sheath after the tourniquet insufflated.  Dissection carried down through the skin and subcutaneous tissue.  The FCR sheath was opened proximally distally.  Going through the floor the FCR sheath the  FPL was then carefully identified and an L-shaped pronator quadratus flap was then elevated exposing the fracture site.  The fracture site was then opened.  This was an intra-articular fracture of 3 more fragments.  In order to reduce the radial column separate intervention was undertaken to release the brachial radialis.  Tendon tenotomy was then carried out reducing the radial column allowing for exposure and reduction of the articular fracture.  The wound was then thoroughly irrigated.  The volar plate was then applied and held distally with a K wire.  Confirmation of the plate placement was then carried out and the oblong screw hole was then placed proximally.  Distal fixation was carried out with a combination of distal locking pegs and one multidirectional screw.  Following this shaft fixation was completed with the locking and nonlocking screws.  The wound was then irrigated.  Final radiographs were then obtained.  The pronator quadratus was then closed with 2-0 Vicryl the subcutaneous tissues closed with 3-0 Monocryl and skin closed with simple Prolene suture.  Adaptic dressing a sterile compressive bandage then applied.  The patient then placed in a well-padded sugar-tong splint taken recovery room in good condition.  POSTOPERATIVE PLAN: Patient be discharged to home.  See him back in the office in 10 to 14 days for wound check suture removal x-rays application of short arm cast placed the therapy order at the first postoperative visit begin an outpatient therapy regimen at the 4-week mark.  Radiographs at each visit.

## 2020-12-15 NOTE — Anesthesia Postprocedure Evaluation (Signed)
Anesthesia Post Note  Patient: Martin Harrington  Procedure(s) Performed: LEFT DISTAL RADIUS OPEN REDUCTION INTERNAL FIXATION (ORIF) WITH REPAIR AS INDICATED (Left )     Patient location during evaluation: PACU Anesthesia Type: Regional and MAC Level of consciousness: awake and alert Pain management: pain level controlled Vital Signs Assessment: post-procedure vital signs reviewed and stable Respiratory status: spontaneous breathing, nonlabored ventilation, respiratory function stable and patient connected to nasal cannula oxygen Cardiovascular status: stable and blood pressure returned to baseline Postop Assessment: no apparent nausea or vomiting Anesthetic complications: no   No complications documented.  Last Vitals:  Vitals:   12/14/20 1726 12/14/20 1741  BP: (!) 161/83 (!) 145/80  Pulse: 72 60  Resp: 15 11  Temp:  36.8 C  SpO2: 93% 97%    Last Pain:  Vitals:   12/14/20 1741  TempSrc:   PainSc: 0-No pain                 Belenda Cruise P Leauna Sharber

## 2020-12-16 ENCOUNTER — Encounter (HOSPITAL_COMMUNITY): Payer: Self-pay | Admitting: Orthopedic Surgery

## 2021-06-05 ENCOUNTER — Other Ambulatory Visit: Payer: Self-pay | Admitting: Internal Medicine

## 2021-06-05 ENCOUNTER — Other Ambulatory Visit (HOSPITAL_COMMUNITY): Payer: Self-pay | Admitting: Internal Medicine

## 2021-06-05 DIAGNOSIS — M5410 Radiculopathy, site unspecified: Secondary | ICD-10-CM

## 2021-06-05 DIAGNOSIS — M5430 Sciatica, unspecified side: Secondary | ICD-10-CM

## 2021-06-05 DIAGNOSIS — M25551 Pain in right hip: Secondary | ICD-10-CM

## 2021-06-17 ENCOUNTER — Ambulatory Visit (HOSPITAL_COMMUNITY)
Admission: RE | Admit: 2021-06-17 | Discharge: 2021-06-17 | Disposition: A | Discharge status: Home / Self Care | Payer: No Typology Code available for payment source | Source: Ambulatory Visit | Attending: Internal Medicine | Admitting: Internal Medicine

## 2021-06-17 ENCOUNTER — Other Ambulatory Visit: Payer: Self-pay

## 2021-06-17 DIAGNOSIS — M5410 Radiculopathy, site unspecified: Secondary | ICD-10-CM | POA: Diagnosis present

## 2021-06-17 DIAGNOSIS — M25551 Pain in right hip: Secondary | ICD-10-CM

## 2022-12-23 IMAGING — DX DG PORTABLE PELVIS
1 series · 1 of 1 positions shown · non-contrast
Comparison: 04/09/2018

CLINICAL DATA: Recent fall with pelvic pain, initial encounter

EXAM:
PORTABLE PELVIS 1 VIEWS

[pelvis]
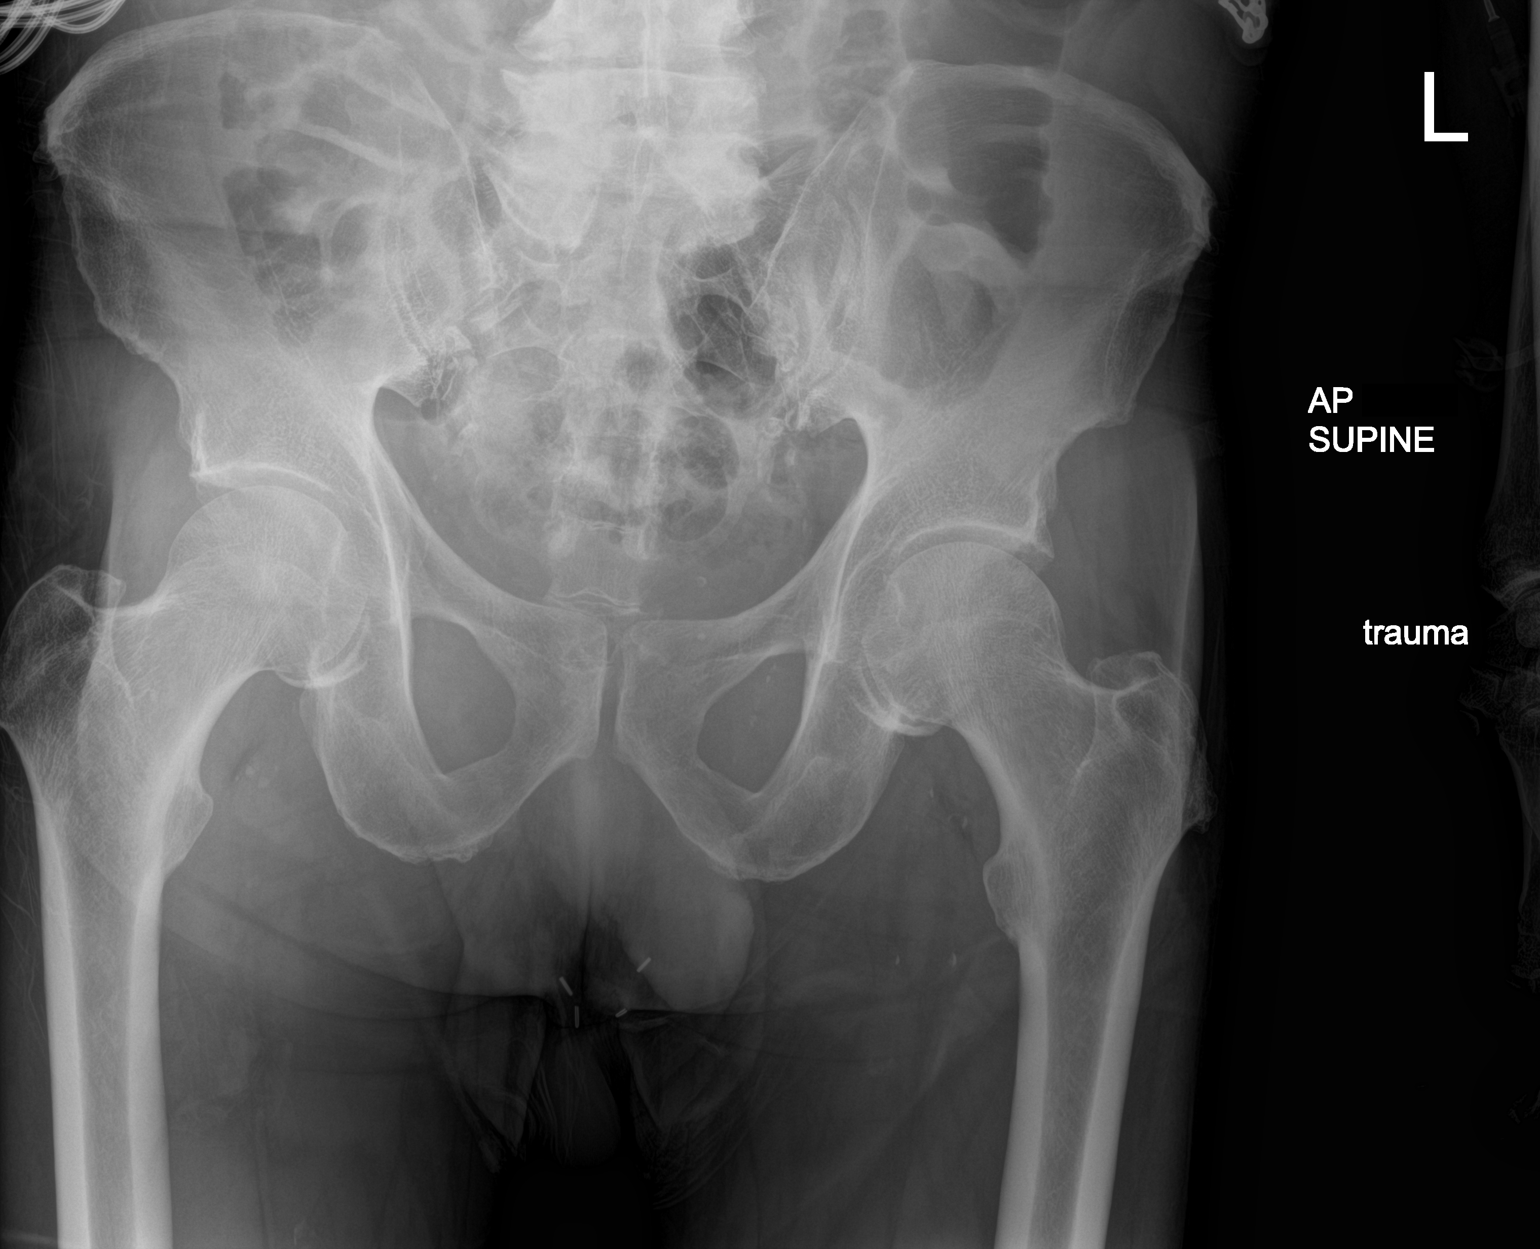

[1 of 1 positions shown; findings below may reference images not displayed]

FINDINGS: There is no evidence of pelvic fracture or diastasis. No pelvic bone
lesions are seen.
IMPRESSION: No acute abnormality noted.

## 2022-12-23 IMAGING — DX DG HAND COMPLETE 3+V*L*
1 series · 3 of 3 positions shown · non-contrast
Comparison: None.

CLINICAL DATA: Left wrist swelling

EXAM:
LEFT HAND - COMPLETE 3+ VIEW

[Series 1: hand · 0.14mm/px · 3 of 3 slices shown]
[im 1/3]
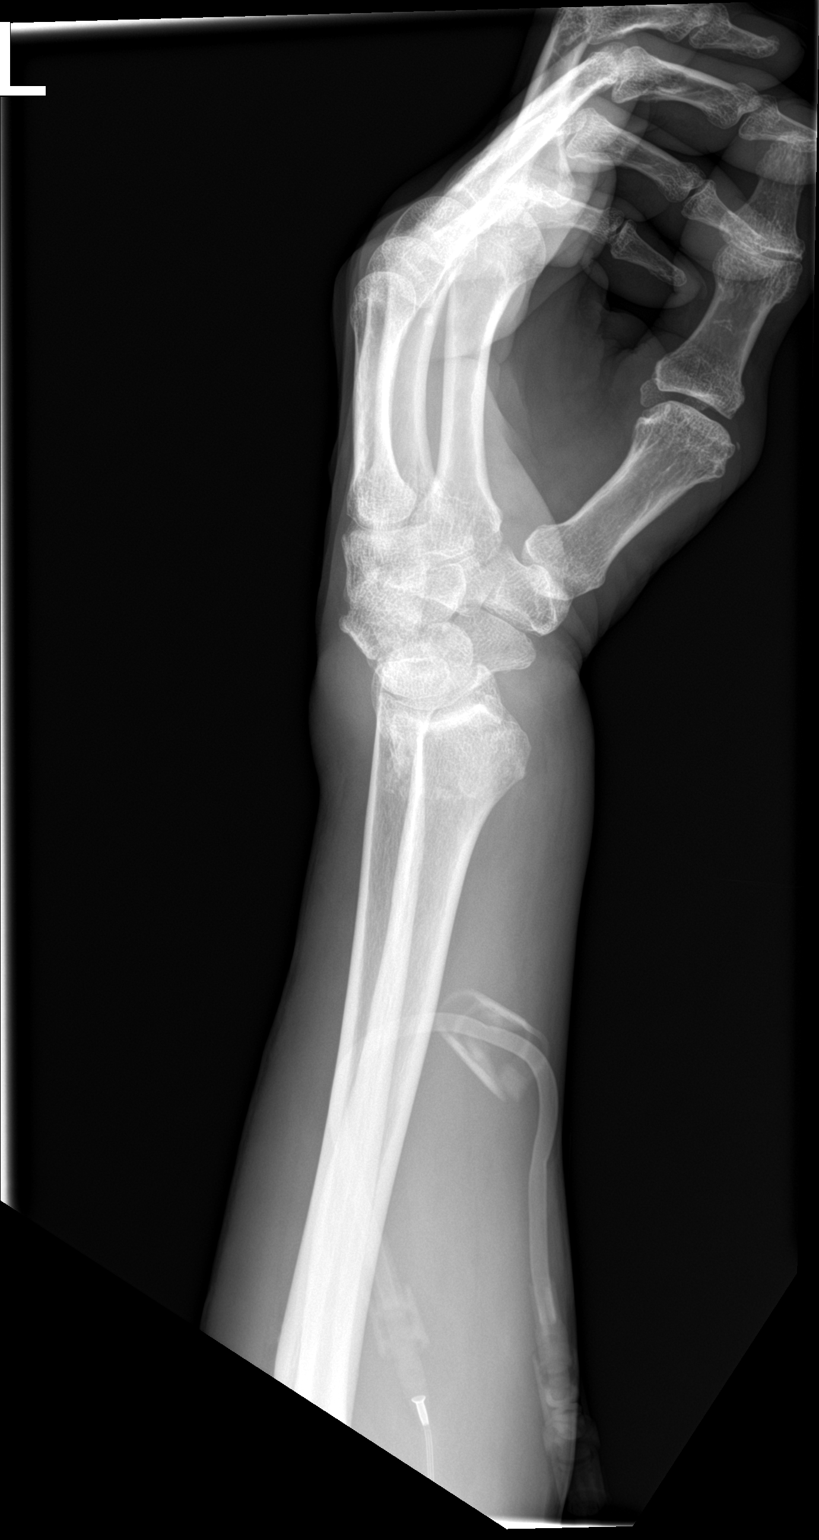
[im 2/3]
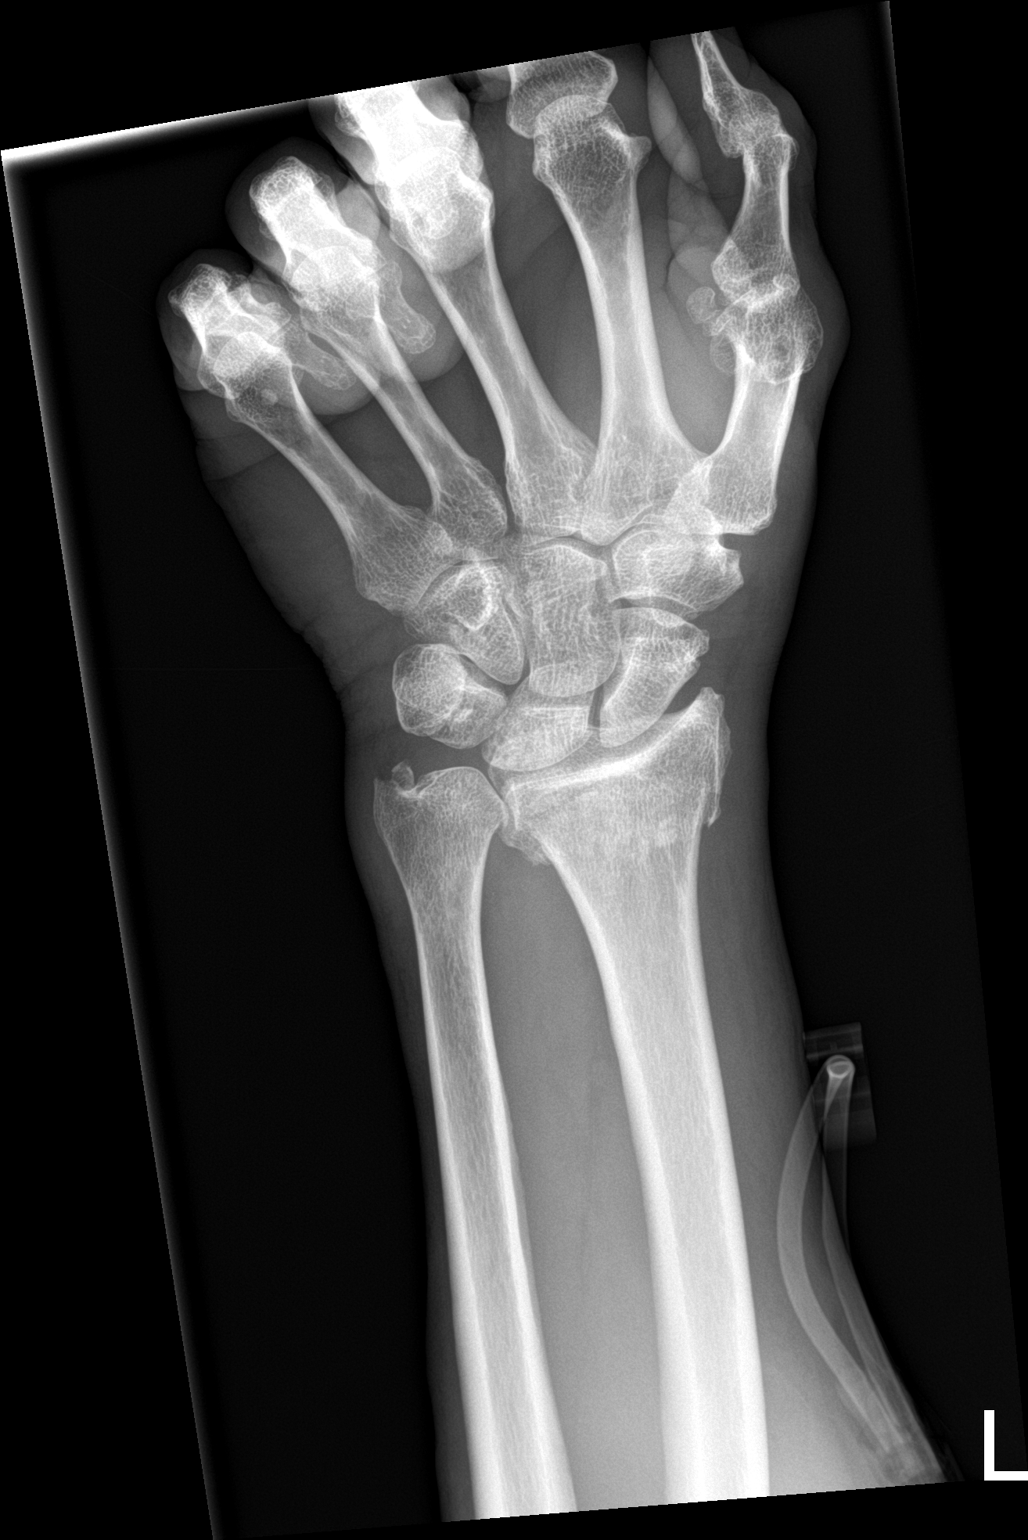
[im 3/3]
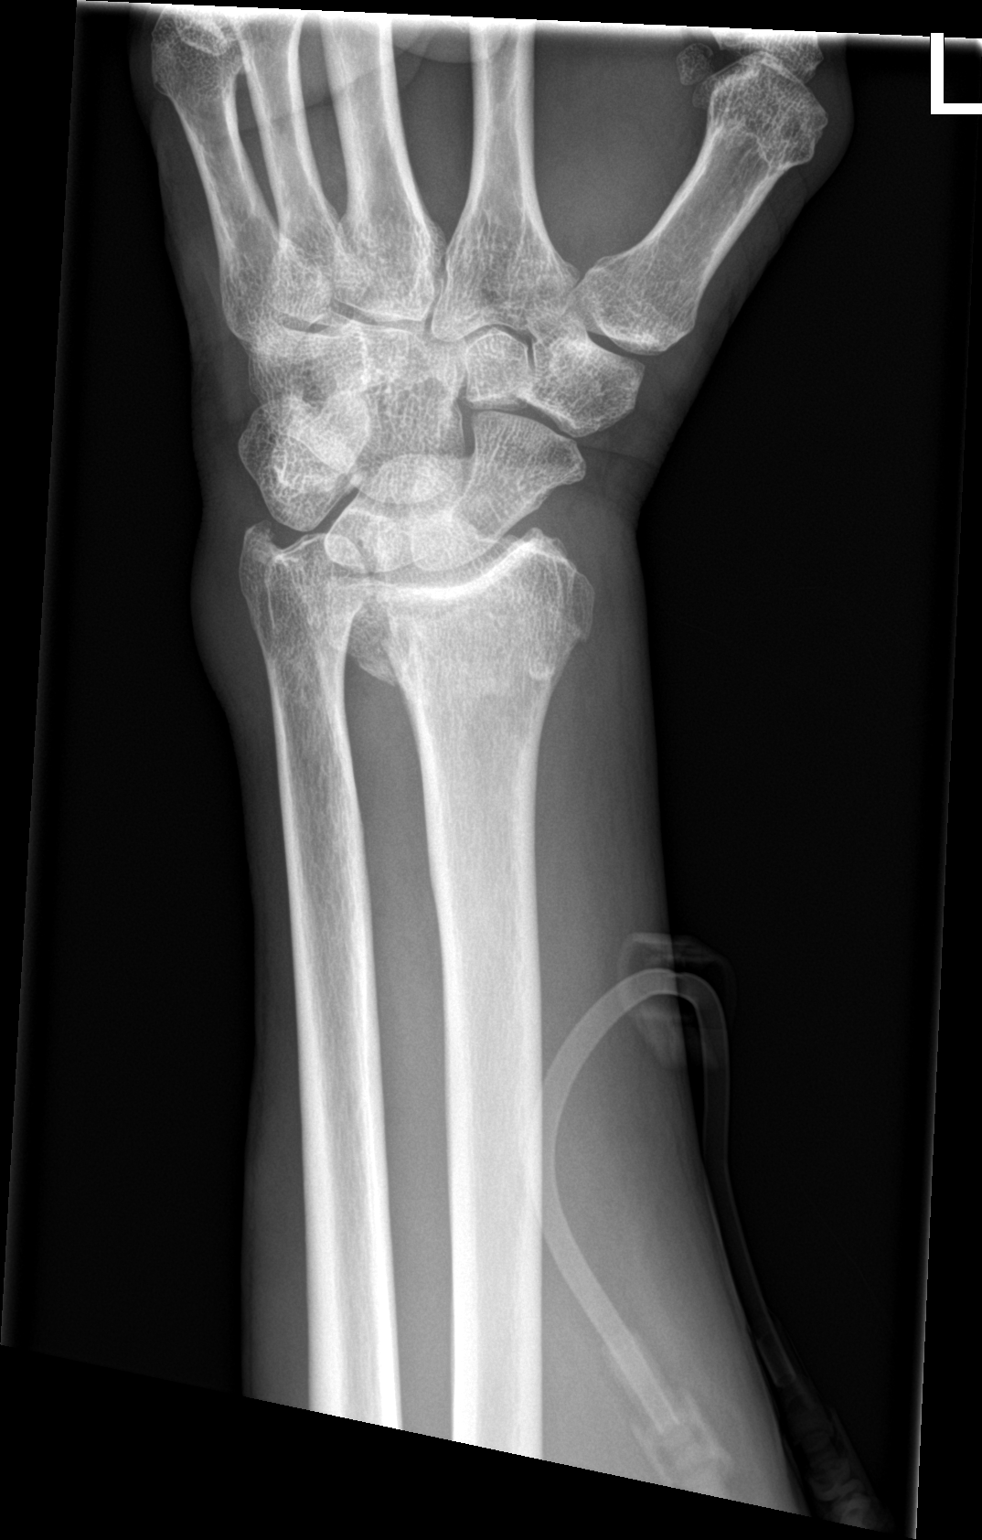

[3 of 3 positions shown; findings below may reference images not displayed]

FINDINGS: Three view radiograph left wrist is slightly limited by suboptimal
positioning, but demonstrates an acute transverse impacted fracture
of the distal left radial metaphysis with moderate dorsal angulation
of the distal fracture fragment with resultant dorsal tilt of the
distal radial articular surface. Mild loss of the normal radial
inclination. Approximately 4-5 mm resultant ulnar positive variance.

Mildly displaced acute fracture of the base of the ulnar styloid
also noted.

Moderate surrounding soft tissue swelling. No additional fracture or
dislocation identified.
IMPRESSION: Acute transverse impacted dorsally angulated fracture of the distal
left radius with resultant dorsal tilt of the distal radial
articular surface and 4-5 mm ulnar positive variance.

Mildly displaced ulnar styloid base fracture.
